# Patient Record
Sex: Male | Born: 1955
Health system: Southern US, Community
[De-identification: ages and names within clinical notes are randomized; demographics above are authoritative.]

## PROBLEM LIST (undated history)

## (undated) DIAGNOSIS — N400 Enlarged prostate without lower urinary tract symptoms: Secondary | ICD-10-CM

## (undated) DIAGNOSIS — K219 Gastro-esophageal reflux disease without esophagitis: Secondary | ICD-10-CM

## (undated) DIAGNOSIS — E785 Hyperlipidemia, unspecified: Secondary | ICD-10-CM

## (undated) HISTORY — DX: Hyperlipidemia, unspecified: E78.5

## (undated) HISTORY — DX: Gastro-esophageal reflux disease without esophagitis: K21.9

## (undated) HISTORY — PX: UPPER GASTROINTESTINAL ENDOSCOPY: SHX188

## (undated) HISTORY — PX: COLONOSCOPY: SHX174

## (undated) HISTORY — PX: POLYPECTOMY: SHX149

## (undated) HISTORY — DX: Benign prostatic hyperplasia without lower urinary tract symptoms: N40.0

---
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
## 2010-06-22 DIAGNOSIS — N400 Enlarged prostate without lower urinary tract symptoms: Secondary | ICD-10-CM

## 2010-06-22 HISTORY — DX: Benign prostatic hyperplasia without lower urinary tract symptoms: N40.0

## 2010-07-02 LAB — HM DIABETES EYE EXAM: HM Diabetic Eye Exam: NORMAL

## 2011-03-03 ENCOUNTER — Other Ambulatory Visit (INDEPENDENT_AMBULATORY_CARE_PROVIDER_SITE_OTHER): Payer: BC Managed Care – PPO

## 2011-03-03 ENCOUNTER — Encounter: Payer: Self-pay | Admitting: Internal Medicine

## 2011-03-03 ENCOUNTER — Ambulatory Visit (INDEPENDENT_AMBULATORY_CARE_PROVIDER_SITE_OTHER): Payer: BC Managed Care – PPO | Admitting: Internal Medicine

## 2011-03-03 VITALS — BP 110/70 | HR 78 | Temp 98.6°F | Resp 16 | Wt 252.0 lb

## 2011-03-03 DIAGNOSIS — D126 Benign neoplasm of colon, unspecified: Secondary | ICD-10-CM

## 2011-03-03 DIAGNOSIS — E7800 Pure hypercholesterolemia, unspecified: Secondary | ICD-10-CM

## 2011-03-03 DIAGNOSIS — K635 Polyp of colon: Secondary | ICD-10-CM | POA: Insufficient documentation

## 2011-03-03 DIAGNOSIS — E785 Hyperlipidemia, unspecified: Secondary | ICD-10-CM | POA: Insufficient documentation

## 2011-03-03 DIAGNOSIS — IMO0001 Reserved for inherently not codable concepts without codable children: Secondary | ICD-10-CM

## 2011-03-03 DIAGNOSIS — E118 Type 2 diabetes mellitus with unspecified complications: Secondary | ICD-10-CM | POA: Insufficient documentation

## 2011-03-03 LAB — URINALYSIS, ROUTINE W REFLEX MICROSCOPIC
Bilirubin Urine: NEGATIVE
Ketones, ur: NEGATIVE
Leukocytes, UA: NEGATIVE
Nitrite: NEGATIVE
Specific Gravity, Urine: >=1.03 (ref 1.000–1.030)
Total Protein, Urine: NEGATIVE
pH: 5.5 (ref 5.0–8.0)

## 2011-03-03 LAB — COMPREHENSIVE METABOLIC PANEL
BUN: 18 mg/dL (ref 6–23)
CO2: 27 mEq/L (ref 19–32)
Calcium: 9.4 mg/dL (ref 8.4–10.5)
Chloride: 104 mEq/L (ref 96–112)
Creatinine, Ser: 0.9 mg/dL (ref 0.4–1.5)
GFR: 90.59 mL/min (ref >60.00)

## 2011-03-03 LAB — LIPID PANEL
Total CHOL/HDL Ratio: 5
Triglycerides: 101 mg/dL (ref 0.0–149.0)

## 2011-03-03 LAB — CBC WITH DIFFERENTIAL/PLATELET
Eosinophils Relative: 6.1 % — ABNORMAL HIGH (ref 0.0–5.0)
HCT: 47.4 % (ref 39.0–52.0)
Hemoglobin: 15.9 g/dL (ref 13.0–17.0)
Lymphs Abs: 1.8 10*3/uL (ref 0.7–4.0)
MCV: 89.2 fl (ref 78.0–100.0)
Monocytes Relative: 8.1 % (ref 3.0–12.0)
Neutro Abs: 3.3 10*3/uL (ref 1.4–7.7)
RDW: 13.3 % (ref 11.5–14.6)
WBC: 5.9 10*3/uL (ref 4.5–10.5)

## 2011-03-03 NOTE — Patient Instructions (Signed)
Diabetes, Type 2 Diabetes is a lasting (chronic) disease. In type 2 diabetes, the pancreas does not make enough insulin (a hormone), and the body does not respond normally to the insulin that is made. This type of diabetes was also previously called adult onset diabetes. About 90% of all those who have diabetes have type 2. It usually occurs after the age of 40 but can occur at any age. CAUSES Unlike type 1 diabetes, which happens because insulin is no longer being made, type 2 diabetes happens because the body is making less insulin and has trouble using the insulin properly. SYMPTOMS  Drinking more than usual.   Urinating more than usual.   Blurred vision.   Dry, itchy skin.   Frequent infection like yeast infections in women.   More tired than usual (fatigue).  TREATMENT  Healthy eating.   Exercise.   Medication, if needed.   Monitoring blood glucose (sugar).   Seeing your caregiver regularly.  HOME CARE INSTRUCTIONS  Check your blood glucose (sugar) at least once daily. More frequent monitoring may be necessary, depending on your medications and on how well your diabetes is controlled. Your caregiver will advise you.   Take your medicine as directed by your caregiver.   Do not smoke.   Make wise food choices. Ask your caregiver for information. Weight loss can improve your diabetes.   Learn about low blood glucose (hypoglycemia) and how to treat it.   Get your eyes checked regularly.   Have a yearly physical exam. Have your blood pressure checked. Get your blood and urine tested.   Wear a pendant or bracelet saying that you have diabetes.   Check your feet every night for sores. Let your caregiver know if you have sores that are not healing.  SEEK MEDICAL CARE IF:  You are having problems keeping your blood glucose at target range.   You feel you might be having problems with your medicines.   You have symptoms of an illness that is not improving after 24  hours.   You have a sore or wound that is not healing.   You notice a change in vision or a new problem with your vision.   You develop a fever of more than 100.5.  Document Released: 06/08/2005 Document Re-Released: 06/30/2009 [employer] Patient Information 2011 [employer]. 

## 2011-03-04 ENCOUNTER — Encounter: Payer: Self-pay | Admitting: Internal Medicine

## 2011-03-04 LAB — MICROALBUMIN / CREATININE URINE RATIO
Creatinine,U: 220.5 mg/dL
Microalb Creat Ratio: 0.5 mg/g (ref 0.0–30.0)

## 2011-03-04 MED ORDER — ATORVASTATIN CALCIUM 40 MG PO TABS: 40.0000 mg | ORAL_TABLET | Freq: Every day | ORAL | Status: DC

## 2011-03-04 NOTE — Assessment & Plan Note (Signed)
Check FLP, TSH, and CMP today 

## 2011-03-04 NOTE — Progress Notes (Signed)
Subjective:    Patient ID: Randy Rodriguez, male    DOB: 11-Sep-1955, 55 y.o.   MRN: [id number]  Diabetes He has type 2 diabetes mellitus. His disease course has been stable. There are no hypoglycemic associated symptoms. Pertinent negatives for hypoglycemia include no pallor. Pertinent negatives for diabetes include no blurred vision, no chest pain, no fatigue, no foot paresthesias, no foot ulcerations, no polydipsia, no polyphagia, no polyuria, no visual change, no weakness and no weight loss. There are no hypoglycemic complications. Symptoms are stable. There are no diabetic complications. Current diabetic treatment includes diet. He is compliant with treatment most of the time. His weight is stable. He is following a generally healthy diet. Meal planning includes avoidance of concentrated sweets. He has not had a previous visit with a dietician. He participates in exercise intermittently. There is no change in his home blood glucose trend. An ACE inhibitor/angiotensin II receptor blocker is not being taken. He does not see a podiatrist.Eye exam is not current.  Hyperlipidemia This is a chronic problem. The current episode started more than 1 year ago. The problem is uncontrolled. Recent lipid tests were reviewed and are variable. Exacerbating diseases include diabetes and obesity. He has no history of chronic renal disease, hypothyroidism, liver disease or nephrotic syndrome. Factors aggravating his hyperlipidemia include no known factors. Pertinent negatives include no chest pain, focal sensory loss, focal weakness, leg pain, myalgias or shortness of breath. He is currently on no antihyperlipidemic treatment. Compliance problems include adherence to diet and adherence to exercise.       Review of Systems  Constitutional: Positive for unexpected weight change (weight gain). Negative for fever, chills, weight loss, diaphoresis, activity change, appetite change and fatigue.  HENT: Negative.   Eyes:  Negative.  Negative for blurred vision.  Respiratory: Negative for apnea, cough, choking, shortness of breath, wheezing and stridor.   Cardiovascular: Negative for chest pain, palpitations and leg swelling.  Gastrointestinal: Negative for nausea, vomiting, abdominal pain, diarrhea, constipation and blood in stool.  Genitourinary: Negative for dysuria, urgency, polyuria, frequency, hematuria, decreased urine volume, enuresis and difficulty urinating.  Musculoskeletal: Negative for myalgias, back pain, joint swelling, arthralgias and gait problem.  Skin: Negative for color change, pallor and rash.  Neurological: Negative.  Negative for focal weakness and weakness.  Hematological: Negative for polydipsia and polyphagia. Does not bruise/bleed easily.  Psychiatric/Behavioral: Negative.        Objective:   Physical Exam  Vitals reviewed. Constitutional: He is oriented to person, place, and time. He appears well-developed and well-nourished. No distress.  HENT:  Mouth/Throat: Oropharynx is clear and moist. No oropharyngeal exudate.  Eyes: Conjunctivae are normal. Right eye exhibits no discharge. Left eye exhibits no discharge. No scleral icterus.  Neck: Normal range of motion. Neck supple. No JVD present. No tracheal deviation present. No thyromegaly present.  Cardiovascular: Normal rate, regular rhythm, normal heart sounds and intact distal pulses.  Exam reveals no gallop and no friction rub.   No murmur heard. Pulmonary/Chest: Effort normal and breath sounds normal. No stridor. No respiratory distress. He has no wheezes. He has no rales. He exhibits no tenderness.  Abdominal: Soft. Bowel sounds are normal. He exhibits no distension and no mass. There is no tenderness. There is no rebound and no guarding.  Musculoskeletal: Normal range of motion. He exhibits no edema and no tenderness.  Lymphadenopathy:    He has no cervical adenopathy.  Neurological: He is oriented to person, place, and time.  He displays normal reflexes. He  exhibits normal muscle tone. Coordination normal.  Skin: Skin is warm and dry. No rash noted. He is not diaphoretic. No erythema. No pallor.  Psychiatric: He has a normal mood and affect. His behavior is normal. Judgment and thought content normal.          Assessment & Plan:

## 2011-03-04 NOTE — Assessment & Plan Note (Signed)
I will check his A1C today 

## 2011-03-05 ENCOUNTER — Encounter: Payer: Self-pay | Admitting: Internal Medicine

## 2011-04-06 ENCOUNTER — Ambulatory Visit (AMBULATORY_SURGERY_CENTER): Payer: BC Managed Care – PPO

## 2011-04-06 VITALS — Ht 75.0 in | Wt 250.0 lb

## 2011-04-06 DIAGNOSIS — Z1211 Encounter for screening for malignant neoplasm of colon: Secondary | ICD-10-CM

## 2011-04-06 MED ORDER — PEG-KCL-NACL-NASULF-NA ASC-C 100 G PO SOLR: 1.0000 | Freq: Once | ORAL | Status: DC

## 2011-04-07 ENCOUNTER — Encounter: Payer: Self-pay | Admitting: Internal Medicine

## 2011-04-17 ENCOUNTER — Ambulatory Visit (AMBULATORY_SURGERY_CENTER): Payer: BC Managed Care – PPO | Admitting: Internal Medicine

## 2011-04-17 ENCOUNTER — Encounter: Payer: Self-pay | Admitting: Internal Medicine

## 2011-04-17 VITALS — BP 142/97 | HR 74 | Temp 97.5°F | Resp 15 | Ht 75.0 in | Wt 250.0 lb

## 2011-04-17 DIAGNOSIS — D128 Benign neoplasm of rectum: Secondary | ICD-10-CM

## 2011-04-17 DIAGNOSIS — D126 Benign neoplasm of colon, unspecified: Secondary | ICD-10-CM

## 2011-04-17 DIAGNOSIS — D129 Benign neoplasm of anus and anal canal: Secondary | ICD-10-CM

## 2011-04-17 DIAGNOSIS — Z1211 Encounter for screening for malignant neoplasm of colon: Secondary | ICD-10-CM

## 2011-04-17 MED ORDER — SODIUM CHLORIDE 0.9 % IV SOLN: 500.0000 mL | INTRAVENOUS | Status: DC

## 2011-04-17 NOTE — Patient Instructions (Signed)
PLEASE FOLLOW DISCHARGE INSTRUCTIONS GIVEN TODAY. COLON POLYPS X2 REMOVED TODAY AND SENT TO LAB, YOU WILL RECEIVE RESULT LETTER IN YOUR MAIL IN 1-2 WEEKS. SEE HANDOUTS GIVEN TODAY FOR POLYPS,DIVERTICULOSIS AND HIGH FIBER DIET. CALL us WITH ANY QUESTIONS OR CONCERNS. THANK YOU!!!!

## 2011-04-20 ENCOUNTER — Telehealth: Payer: Self-pay | Admitting: *Deleted

## 2011-04-20 NOTE — Telephone Encounter (Signed)
LMOM

## 2011-04-26 ENCOUNTER — Encounter: Payer: Self-pay | Admitting: Internal Medicine

## 2011-08-19 ENCOUNTER — Ambulatory Visit (INDEPENDENT_AMBULATORY_CARE_PROVIDER_SITE_OTHER): Payer: BC Managed Care – PPO | Admitting: Internal Medicine

## 2011-08-19 ENCOUNTER — Encounter: Payer: Self-pay | Admitting: Internal Medicine

## 2011-08-19 ENCOUNTER — Other Ambulatory Visit (INDEPENDENT_AMBULATORY_CARE_PROVIDER_SITE_OTHER): Payer: BC Managed Care – PPO

## 2011-08-19 DIAGNOSIS — E7800 Pure hypercholesterolemia, unspecified: Secondary | ICD-10-CM

## 2011-08-19 DIAGNOSIS — Z23 Encounter for immunization: Secondary | ICD-10-CM

## 2011-08-19 LAB — COMPREHENSIVE METABOLIC PANEL
ALT: 29 U/L (ref 0–53)
BUN: 17 mg/dL (ref 6–23)
CO2: 32 mEq/L (ref 19–32)
Creatinine, Ser: 1 mg/dL (ref 0.4–1.5)
GFR: 79.39 mL/min (ref >60.00)
Total Bilirubin: 0.7 mg/dL (ref 0.3–1.2)

## 2011-08-19 LAB — LIPID PANEL
Cholesterol: 156 mg/dL (ref 0–200)
LDL Cholesterol: 91 mg/dL (ref 0–99)
Triglycerides: 83 mg/dL (ref 0.0–149.0)
VLDL: 16.6 mg/dL (ref 0.0–40.0)

## 2011-08-19 LAB — TSH: TSH: 0.78 u[IU]/mL (ref 0.35–5.50)

## 2011-08-19 LAB — HM DIABETES FOOT EXAM: HM Diabetic Foot Exam: NORMAL

## 2011-08-19 NOTE — Progress Notes (Signed)
  Subjective:    Patient ID: Randy Rodriguez, male    DOB: [birth year], 56 y.o.   MRN: [id number]  Diabetes He presents for his follow-up diabetic visit. He has type 2 diabetes mellitus. His disease course has been stable. There are no hypoglycemic associated symptoms. Pertinent negatives for diabetes include no blurred vision, no chest pain, no fatigue, no foot paresthesias, no foot ulcerations, no polydipsia, no polyphagia, no polyuria, no visual change, no weakness and no weight loss. There are no hypoglycemic complications. Symptoms are stable. There are no diabetic complications. Current diabetic treatment includes diet. He is compliant with treatment all of the time. His weight is stable. He is following a generally healthy diet. Meal planning includes avoidance of concentrated sweets. He has not had a previous visit with a dietician. He participates in exercise every other day. There is no change in his home blood glucose trend. An ACE inhibitor/angiotensin II receptor blocker is not being taken. He does not see a podiatrist.Eye exam is current.      Review of Systems  Constitutional: Negative.  Negative for weight loss and fatigue.  HENT: Negative.   Eyes: Negative.  Negative for blurred vision.  Respiratory: Negative.   Cardiovascular: Negative.  Negative for chest pain.  Gastrointestinal: Negative.   Genitourinary: Negative.  Negative for polyuria.  Musculoskeletal: Negative.   Skin: Negative.   Neurological: Negative.  Negative for weakness.  Hematological: Negative.  Negative for polydipsia and polyphagia.  Psychiatric/Behavioral: Negative.        Objective:   Physical Exam  Vitals reviewed. Constitutional: He is oriented to person, place, and time. He appears well-developed and well-nourished. No distress.  HENT:  Head: Normocephalic and atraumatic.  Mouth/Throat: Oropharynx is clear and moist. No oropharyngeal exudate.  Eyes: Conjunctivae are normal. Right eye exhibits no  discharge. Left eye exhibits no discharge. No scleral icterus.  Neck: Normal range of motion. Neck supple. No JVD present. No tracheal deviation present. No thyromegaly present.  Cardiovascular: Normal rate, regular rhythm, normal heart sounds and intact distal pulses.  Exam reveals no gallop and no friction rub.   No murmur heard. Pulmonary/Chest: Effort normal and breath sounds normal. No stridor. No respiratory distress. He has no wheezes. He has no rales. He exhibits no tenderness.  Abdominal: Soft. Bowel sounds are normal. He exhibits no distension and no mass. There is no tenderness. There is no rebound and no guarding.  Musculoskeletal: Normal range of motion. He exhibits no edema and no tenderness.  Lymphadenopathy:    He has no cervical adenopathy.  Neurological: He is oriented to person, place, and time.  Skin: Skin is warm and dry. No rash noted. He is not diaphoretic. No erythema. No pallor.  Psychiatric: He has a normal mood and affect. His behavior is normal. Judgment and thought content normal.          Lab Results  Component Value Date   WBC 5.9 03/03/2011   HGB 15.9 03/03/2011   HCT 47.4 03/03/2011   PLT 189.0 03/03/2011   GLUCOSE 114* 03/03/2011   CHOL 195 03/03/2011   TRIG 101.0 03/03/2011   HDL 42.50 03/03/2011   LDLCALC 132* 03/03/2011   ALT 26 03/03/2011   AST 20 03/03/2011   NA 140 03/03/2011   K 4.3 03/03/2011   CL 104 03/03/2011   CREATININE 0.9 03/03/2011   BUN 18 03/03/2011   CO2 27 03/03/2011   HGBA1C 7.1* 03/03/2011   MICROALBUR 1.0 03/03/2011   Assessment & Plan:

## 2011-08-19 NOTE — Patient Instructions (Signed)

## 2011-08-21 NOTE — Assessment & Plan Note (Signed)
FLP and CMP today 

## 2011-08-21 NOTE — Assessment & Plan Note (Signed)
I will check his a1c today and will monitor his renal function 

## 2011-12-17 ENCOUNTER — Other Ambulatory Visit (INDEPENDENT_AMBULATORY_CARE_PROVIDER_SITE_OTHER): Payer: BC Managed Care – PPO

## 2011-12-17 ENCOUNTER — Ambulatory Visit (INDEPENDENT_AMBULATORY_CARE_PROVIDER_SITE_OTHER): Payer: BC Managed Care – PPO | Admitting: Internal Medicine

## 2011-12-17 ENCOUNTER — Encounter: Payer: Self-pay | Admitting: Internal Medicine

## 2011-12-17 VITALS — BP 114/76 | HR 69 | Temp 97.1°F | Resp 16 | Wt 248.0 lb

## 2011-12-17 DIAGNOSIS — IMO0001 Reserved for inherently not codable concepts without codable children: Secondary | ICD-10-CM

## 2011-12-17 DIAGNOSIS — E7800 Pure hypercholesterolemia, unspecified: Secondary | ICD-10-CM

## 2011-12-17 LAB — BASIC METABOLIC PANEL
Calcium: 9.6 mg/dL (ref 8.4–10.5)
Creatinine, Ser: 1 mg/dL (ref 0.4–1.5)
GFR: 82.05 mL/min (ref >60.00)

## 2011-12-17 LAB — URINALYSIS, ROUTINE W REFLEX MICROSCOPIC
Hgb urine dipstick: NEGATIVE
Total Protein, Urine: NEGATIVE
Urine Glucose: NEGATIVE
pH: 6 (ref 5.0–8.0)

## 2011-12-17 LAB — HEMOGLOBIN A1C: Hgb A1c MFr Bld: 7.6 % — ABNORMAL HIGH (ref 4.6–6.5)

## 2011-12-17 MED ORDER — SAXAGLIPTIN HCL 5 MG PO TABS: 5.0000 mg | ORAL_TABLET | Freq: Every day | ORAL | Status: DC

## 2011-12-17 MED ORDER — ATORVASTATIN CALCIUM 40 MG PO TABS: 40.0000 mg | ORAL_TABLET | Freq: Every day | ORAL | Status: DC

## 2011-12-17 NOTE — Patient Instructions (Addendum)

## 2011-12-17 NOTE — Assessment & Plan Note (Signed)
He is doing well on lipitor 

## 2011-12-17 NOTE — Progress Notes (Signed)
Subjective:    Patient ID: Randy Rodriguez, male    DOB: [birth year], 56 y.o.   MRN: [id number]  Diabetes He presents for his follow-up diabetic visit. He has type 2 diabetes mellitus. His disease course has been stable. There are no hypoglycemic associated symptoms. Associated symptoms include foot paresthesias. Pertinent negatives for diabetes include no blurred vision, no chest pain, no fatigue, no foot ulcerations, no polydipsia, no polyphagia, no polyuria, no visual change, no weakness and no weight loss. There are no hypoglycemic complications. Symptoms are stable. There are no diabetic complications. Current diabetic treatment includes diet. He is compliant with treatment most of the time. His weight is stable. He is following a generally healthy diet. Meal planning includes avoidance of concentrated sweets. He has not had a previous visit with a dietician. He participates in exercise intermittently. There is no change in his home blood glucose trend. An ACE inhibitor/angiotensin II receptor blocker is not being taken. He does not see a podiatrist.Eye exam is current.      Review of Systems  Constitutional: Negative for fever, chills, weight loss, diaphoresis, activity change, appetite change, fatigue and unexpected weight change.  HENT: Negative.   Eyes: Negative.  Negative for blurred vision.  Respiratory: Negative for cough, chest tightness, shortness of breath, wheezing and stridor.   Cardiovascular: Negative for chest pain, palpitations and leg swelling.  Gastrointestinal: Negative.   Genitourinary: Negative.  Negative for polyuria.  Musculoskeletal: Negative.   Skin: Negative.   Neurological: Negative.  Negative for weakness.  Hematological: Negative for polydipsia, polyphagia and adenopathy. Does not bruise/bleed easily.  Psychiatric/Behavioral: Negative.        Objective:   Physical Exam  Vitals reviewed. Constitutional: He is oriented to person, place, and time. He appears  well-developed and well-nourished. No distress.  HENT:  Head: Normocephalic and atraumatic.  Mouth/Throat: Oropharynx is clear and moist. No oropharyngeal exudate.  Eyes: Conjunctivae are normal. Right eye exhibits no discharge. Left eye exhibits no discharge. No scleral icterus.  Neck: Normal range of motion. Neck supple. No JVD present. No tracheal deviation present. No thyromegaly present.  Cardiovascular: Normal rate, regular rhythm, normal heart sounds and intact distal pulses.  Exam reveals no gallop and no friction rub.   No murmur heard. Pulmonary/Chest: Effort normal and breath sounds normal. No stridor. No respiratory distress. He has no wheezes. He has no rales. He exhibits no tenderness.  Abdominal: Soft. Bowel sounds are normal. He exhibits no distension and no mass. There is no tenderness. There is no rebound and no guarding.  Musculoskeletal: Normal range of motion. He exhibits no edema and no tenderness.  Lymphadenopathy:    He has no cervical adenopathy.  Neurological: He is oriented to person, place, and time.  Skin: Skin is warm and dry. No rash noted. He is not diaphoretic. No erythema. No pallor.  Psychiatric: He has a normal mood and affect. His behavior is normal. Judgment and thought content normal.     Lab Results  Component Value Date   WBC 5.9 03/03/2011   HGB 15.9 03/03/2011   HCT 47.4 03/03/2011   PLT 189.0 03/03/2011   GLUCOSE 140* 08/19/2011   CHOL 156 08/19/2011   TRIG 83.0 08/19/2011   HDL 48.00 08/19/2011   LDLCALC 91 08/19/2011   ALT 29 08/19/2011   AST 19 08/19/2011   NA 140 08/19/2011   K 4.6 08/19/2011   CL 104 08/19/2011   CREATININE 1.0 08/19/2011   BUN 17 08/19/2011   CO2 32 08/19/2011  TSH 0.78 08/19/2011   HGBA1C 7.8* 08/19/2011   MICROALBUR 1.0 03/03/2011       Assessment & Plan:

## 2011-12-17 NOTE — Assessment & Plan Note (Signed)
Start onglyza, I will recheck his a1c and will check his renal function today

## 2011-12-18 ENCOUNTER — Encounter: Payer: Self-pay | Admitting: Internal Medicine

## 2011-12-18 LAB — MICROALBUMIN / CREATININE URINE RATIO
Creatinine,U: 232.6 mg/dL
Microalb, Ur: 1.3 mg/dL (ref 0.0–1.9)

## 2012-02-23 ENCOUNTER — Other Ambulatory Visit: Payer: Self-pay

## 2012-02-23 MED ORDER — SAXAGLIPTIN HCL 5 MG PO TABS: 5.0000 mg | ORAL_TABLET | Freq: Every day | ORAL | Status: DC

## 2012-04-20 ENCOUNTER — Other Ambulatory Visit (INDEPENDENT_AMBULATORY_CARE_PROVIDER_SITE_OTHER): Payer: BC Managed Care – PPO

## 2012-04-20 ENCOUNTER — Encounter: Payer: Self-pay | Admitting: Internal Medicine

## 2012-04-20 ENCOUNTER — Ambulatory Visit (INDEPENDENT_AMBULATORY_CARE_PROVIDER_SITE_OTHER): Payer: BC Managed Care – PPO | Admitting: Internal Medicine

## 2012-04-20 VITALS — BP 118/80 | HR 70 | Temp 97.3°F | Resp 18 | Wt 244.0 lb

## 2012-04-20 LAB — COMPREHENSIVE METABOLIC PANEL
ALT: 23 U/L (ref 0–53)
AST: 19 U/L (ref 0–37)
Creatinine, Ser: 1 mg/dL (ref 0.4–1.5)
GFR: 78.32 mL/min (ref >60.00)
Total Bilirubin: 0.7 mg/dL (ref 0.3–1.2)

## 2012-04-20 LAB — HM DIABETES FOOT EXAM: HM Diabetic Foot Exam: NORMAL

## 2012-04-20 LAB — HEMOGLOBIN A1C: Hgb A1c MFr Bld: 7.2 % — ABNORMAL HIGH (ref 4.6–6.5)

## 2012-04-20 MED ORDER — METFORMIN HCL 500 MG PO TABS: 500.0000 mg | ORAL_TABLET | Freq: Two times a day (BID) | ORAL | Status: DC

## 2012-04-20 NOTE — Assessment & Plan Note (Signed)
He tells me that onglyza is too expensive so I have changed to metformin, today I will check his a1c and his renal function

## 2012-04-20 NOTE — Progress Notes (Signed)
Subjective:    Patient ID: Randy Rodriguez, male    DOB: Nov 22, 1955, 56 y.o.   MRN: [id number]  Diabetes He presents for his follow-up diabetic visit. He has type 2 diabetes mellitus. His disease course has been stable. There are no hypoglycemic associated symptoms. Pertinent negatives for hypoglycemia include no pallor. Associated symptoms include fatigue. Pertinent negatives for diabetes include no blurred vision, no chest pain, no foot paresthesias, no foot ulcerations, no polydipsia, no polyphagia, no polyuria, no visual change, no weakness and no weight loss. There are no hypoglycemic complications. Symptoms are stable. There are no diabetic complications. Current diabetic treatment includes oral agent (monotherapy). He is compliant with treatment all of the time. His weight is decreasing steadily. He is following a generally healthy diet. Meal planning includes avoidance of concentrated sweets. He participates in exercise intermittently. There is no change in his home blood glucose trend. An ACE inhibitor/angiotensin II receptor blocker is not being taken. He does not see a podiatrist.Eye exam is not current.      Review of Systems  Constitutional: Positive for fatigue. Negative for fever, chills, weight loss, diaphoresis, activity change, appetite change and unexpected weight change.  HENT: Negative.   Eyes: Negative.  Negative for blurred vision.  Respiratory: Negative for choking, chest tightness, shortness of breath, wheezing and stridor.   Cardiovascular: Negative for chest pain, palpitations and leg swelling.  Gastrointestinal: Negative for nausea, vomiting, abdominal pain, diarrhea, constipation and anal bleeding.  Genitourinary: Negative.  Negative for polyuria.  Musculoskeletal: Negative for myalgias, back pain, joint swelling, arthralgias and gait problem.  Skin: Negative for color change, pallor, rash and wound.  Neurological: Negative.  Negative for weakness.  Hematological:  Negative for polydipsia, polyphagia and adenopathy. Does not bruise/bleed easily.  Psychiatric/Behavioral: Negative.        Objective:   Physical Exam  Vitals reviewed. Constitutional: He is oriented to person, place, and time. He appears well-developed and well-nourished. No distress.  HENT:  Head: Normocephalic and atraumatic.  Mouth/Throat: Oropharynx is clear and moist. No oropharyngeal exudate.  Eyes: Conjunctivae normal are normal. Right eye exhibits no discharge. Left eye exhibits no discharge. No scleral icterus.  Neck: Normal range of motion. Neck supple. No JVD present. No tracheal deviation present. No thyromegaly present.  Cardiovascular: Normal rate, regular rhythm, normal heart sounds and intact distal pulses.  Exam reveals no gallop and no friction rub.   No murmur heard. Pulmonary/Chest: Effort normal and breath sounds normal. No stridor. No respiratory distress. He has no wheezes. He has no rales. He exhibits no tenderness.  Abdominal: Soft. Bowel sounds are normal. He exhibits no distension and no mass. There is no tenderness. There is no rebound and no guarding.  Musculoskeletal: Normal range of motion. He exhibits no edema and no tenderness.  Lymphadenopathy:    He has no cervical adenopathy.  Neurological: He is oriented to person, place, and time.  Skin: Skin is warm and dry. No rash noted. He is not diaphoretic. No erythema. No pallor.  Psychiatric: He has a normal mood and affect. His behavior is normal. Judgment and thought content normal.     Lab Results  Component Value Date   WBC 5.9 03/03/2011   HGB 15.9 03/03/2011   HCT 47.4 03/03/2011   PLT 189.0 03/03/2011   GLUCOSE 137* 12/17/2011   CHOL 156 08/19/2011   TRIG 83.0 08/19/2011   HDL 48.00 08/19/2011   LDLCALC 91 08/19/2011   ALT 29 08/19/2011   AST 19 08/19/2011  NA 138 12/17/2011   K 4.5 12/17/2011   CL 105 12/17/2011   CREATININE 1.0 12/17/2011   BUN 18 12/17/2011   CO2 27 12/17/2011   TSH 0.78 08/19/2011    HGBA1C 7.6* 12/17/2011   MICROALBUR 1.3 12/17/2011       Assessment & Plan:

## 2012-04-20 NOTE — Patient Instructions (Signed)

## 2012-08-19 ENCOUNTER — Encounter: Payer: Self-pay | Admitting: Internal Medicine

## 2012-08-19 ENCOUNTER — Other Ambulatory Visit (INDEPENDENT_AMBULATORY_CARE_PROVIDER_SITE_OTHER): Payer: Managed Care, Other (non HMO)

## 2012-08-19 ENCOUNTER — Ambulatory Visit (INDEPENDENT_AMBULATORY_CARE_PROVIDER_SITE_OTHER): Payer: Managed Care, Other (non HMO) | Admitting: Internal Medicine

## 2012-08-19 VITALS — BP 118/72 | HR 75 | Temp 97.6°F | Resp 16 | Ht 73.0 in | Wt 246.0 lb

## 2012-08-19 DIAGNOSIS — IMO0001 Reserved for inherently not codable concepts without codable children: Secondary | ICD-10-CM

## 2012-08-19 DIAGNOSIS — E7800 Pure hypercholesterolemia, unspecified: Secondary | ICD-10-CM

## 2012-08-19 DIAGNOSIS — Z0000 Encounter for general adult medical examination without abnormal findings: Secondary | ICD-10-CM | POA: Insufficient documentation

## 2012-08-19 DIAGNOSIS — Z23 Encounter for immunization: Secondary | ICD-10-CM

## 2012-08-19 LAB — COMPREHENSIVE METABOLIC PANEL
AST: 22 U/L (ref 0–37)
Albumin: 4.1 g/dL (ref 3.5–5.2)
Alkaline Phosphatase: 79 U/L (ref 39–117)
BUN: 22 mg/dL (ref 6–23)
Creatinine, Ser: 1.1 mg/dL (ref 0.4–1.5)
Glucose, Bld: 145 mg/dL — ABNORMAL HIGH (ref 70–99)
Potassium: 4.6 mEq/L (ref 3.5–5.1)

## 2012-08-19 LAB — CBC WITH DIFFERENTIAL/PLATELET
Basophils Relative: 0.8 % (ref 0.0–3.0)
Eosinophils Relative: 9.8 % — ABNORMAL HIGH (ref 0.0–5.0)
Hemoglobin: 15.9 g/dL (ref 13.0–17.0)
Lymphocytes Relative: 30.3 % (ref 12.0–46.0)
MCV: 86.8 fl (ref 78.0–100.0)
Monocytes Absolute: 0.5 10*3/uL (ref 0.1–1.0)
Neutro Abs: 3.4 10*3/uL (ref 1.4–7.7)
Neutrophils Relative %: 52 % (ref 43.0–77.0)
RBC: 5.35 Mil/uL (ref 4.22–5.81)
WBC: 6.6 10*3/uL (ref 4.5–10.5)

## 2012-08-19 LAB — URINALYSIS, ROUTINE W REFLEX MICROSCOPIC
Nitrite: NEGATIVE
Specific Gravity, Urine: 1.025 (ref 1.000–1.030)
Total Protein, Urine: NEGATIVE
Urobilinogen, UA: 0.2 (ref 0.0–1.0)

## 2012-08-19 LAB — LIPID PANEL
HDL: 41.6 mg/dL (ref >39.00)
LDL Cholesterol: 94 mg/dL (ref 0–99)
Total CHOL/HDL Ratio: 4
Triglycerides: 103 mg/dL (ref 0.0–149.0)

## 2012-08-19 LAB — FECAL OCCULT BLOOD, GUAIAC: Fecal Occult Blood: NEGATIVE

## 2012-08-19 LAB — PSA: PSA: 0.76 ng/mL (ref 0.10–4.00)

## 2012-08-19 NOTE — Assessment & Plan Note (Signed)
I will check his a1c and will monitor his renal function 

## 2012-08-19 NOTE — Assessment & Plan Note (Signed)
Exam done Vaccines were updated Labs ordered Pt ed material was given 

## 2012-08-19 NOTE — Assessment & Plan Note (Signed)
He is doing well on lipitor 

## 2012-08-19 NOTE — Progress Notes (Signed)
Subjective:    Patient ID: Randy Rodriguez, male    DOB: 1956-04-04, 58 y.o.   MRN: [id number]  Diabetes He presents for his follow-up diabetic visit. He has type 2 diabetes mellitus. His disease course has been worsening. There are no hypoglycemic associated symptoms. Pertinent negatives for hypoglycemia include no dizziness or pallor. Associated symptoms include polyuria. Pertinent negatives for diabetes include no blurred vision, no chest pain, no fatigue, no foot paresthesias, no foot ulcerations, no polydipsia, no polyphagia, no visual change, no weakness and no weight loss. There are no hypoglycemic complications. Symptoms are stable. There are no diabetic complications. Current diabetic treatment includes oral agent (monotherapy). He is compliant with treatment most of the time. His weight is increasing steadily. He is following a generally unhealthy diet. When asked about meal planning, he reported none. He has not had a previous visit with a dietician. He never participates in exercise. There is no change in his home blood glucose trend. An ACE inhibitor/angiotensin II receptor blocker is not being taken. He does not see a podiatrist.Eye exam is current.      Review of Systems  Constitutional: Positive for unexpected weight change (some weight gain). Negative for fever, chills, weight loss, diaphoresis, activity change, appetite change and fatigue.  HENT: Negative.   Eyes: Negative.  Negative for blurred vision.  Respiratory: Negative for apnea, cough, chest tightness, shortness of breath, wheezing and stridor.   Cardiovascular: Negative.  Negative for chest pain, palpitations and leg swelling.  Gastrointestinal: Negative.  Negative for nausea, vomiting, abdominal pain, diarrhea, constipation and blood in stool.  Endocrine: Positive for polyuria. Negative for cold intolerance, heat intolerance, polydipsia and polyphagia.  Genitourinary: Negative for dysuria, frequency, hematuria, flank pain,  decreased urine volume, scrotal swelling, difficulty urinating and testicular pain.  Musculoskeletal: Negative.  Negative for myalgias, back pain, joint swelling, arthralgias and gait problem.  Skin: Negative.  Negative for color change, pallor, rash and wound.  Allergic/Immunologic: Negative.   Neurological: Negative.  Negative for dizziness and weakness.  Hematological: Negative.  Negative for adenopathy. Does not bruise/bleed easily.  Psychiatric/Behavioral: Negative.        Objective:   Physical Exam  Vitals reviewed. Constitutional: He is oriented to person, place, and time. He appears well-developed and well-nourished. No distress.  HENT:  Head: Normocephalic and atraumatic.  Mouth/Throat: Oropharynx is clear and moist. No oropharyngeal exudate.  Eyes: Conjunctivae are normal. Right eye exhibits no discharge. Left eye exhibits no discharge. No scleral icterus.  Neck: Normal range of motion. Neck supple. No JVD present. No tracheal deviation present. No thyromegaly present.  Cardiovascular: Normal rate, regular rhythm, normal heart sounds and intact distal pulses.  Exam reveals no gallop and no friction rub.   No murmur heard. Pulmonary/Chest: Effort normal and breath sounds normal. No stridor. No respiratory distress. He has no wheezes. He has no rales. He exhibits no tenderness.  Abdominal: Soft. Bowel sounds are normal. He exhibits no distension and no mass. There is no tenderness. There is no rebound and no guarding. Hernia confirmed negative in the right inguinal area and confirmed negative in the left inguinal area.  Genitourinary: Rectum normal, prostate normal, testes normal and penis normal. Rectal exam shows no external hemorrhoid, no internal hemorrhoid, no fissure, no mass, no tenderness and anal tone normal. Guaiac negative stool. Prostate is not enlarged and not tender. Right testis shows no mass, no swelling and no tenderness. Right testis is descended. Left testis shows no  mass, no swelling and no tenderness.  Left testis is descended. Circumcised. No penile erythema or penile tenderness. No discharge found.  Musculoskeletal: Normal range of motion. He exhibits no edema and no tenderness.  Lymphadenopathy:    He has no cervical adenopathy.       Right: No inguinal adenopathy present.       Left: No inguinal adenopathy present.  Neurological: He is oriented to person, place, and time.  Skin: Skin is warm and dry. No rash noted. He is not diaphoretic. No erythema. No pallor.  Psychiatric: He has a normal mood and affect. His behavior is normal. Judgment and thought content normal.      Lab Results  Component Value Date   WBC 5.9 03/03/2011   HGB 15.9 03/03/2011   HCT 47.4 03/03/2011   PLT 189.0 03/03/2011   GLUCOSE 136* 04/20/2012   CHOL 156 08/19/2011   TRIG 83.0 08/19/2011   HDL 48.00 08/19/2011   LDLCALC 91 08/19/2011   ALT 23 04/20/2012   AST 19 04/20/2012   NA 140 04/20/2012   K 5.0 04/20/2012   CL 104 04/20/2012   CREATININE 1.0 04/20/2012   BUN 17 04/20/2012   CO2 31 04/20/2012   TSH 0.78 08/19/2011   HGBA1C 7.2* 04/20/2012   MICROALBUR 1.3 12/17/2011      Assessment & Plan:

## 2012-08-19 NOTE — Patient Instructions (Signed)
Health Maintenance, Males A healthy lifestyle and preventative care can promote health and wellness.  Maintain regular health, dental, and eye exams.  Eat a healthy diet. Foods like vegetables, fruits, whole grains, low-fat dairy products, and lean protein foods contain the nutrients you need without too many calories. Decrease your intake of foods high in solid fats, added sugars, and salt. Get information about a proper diet from your caregiver, if necessary.  Regular physical exercise is one of the most important things you can do for your health. Most adults should get at least 150 minutes of moderate-intensity exercise (any activity that increases your heart rate and causes you to sweat) each week. In addition, most adults need muscle-strengthening exercises on 2 or more days a week.   Maintain a healthy weight. The body mass index (BMI) is a screening tool to identify possible weight problems. It provides an estimate of body fat based on height and weight. Your caregiver can help determine your BMI, and can help you achieve or maintain a healthy weight. For adults 20 years and older:  A BMI below 18.5 is considered underweight.  A BMI of 18.5 to 24.9 is normal.  A BMI of 25 to 29.9 is considered overweight.  A BMI of 30 and above is considered obese.  Maintain normal blood lipids and cholesterol by exercising and minimizing your intake of saturated fat. Eat a balanced diet with plenty of fruits and vegetables. Blood tests for lipids and cholesterol should begin at age 20 and be repeated every 5 years. If your lipid or cholesterol levels are high, you are over 50, or you are a high risk for heart disease, you may need your cholesterol levels checked more frequently.Ongoing high lipid and cholesterol levels should be treated with medicines, if diet and exercise are not effective.  If you smoke, find out from your caregiver how to quit. If you do not use tobacco, do not start.  If you  choose to drink alcohol, do not exceed 2 drinks per day. One drink is considered to be 12 ounces (355 mL) of beer, 5 ounces (148 mL) of wine, or 1.5 ounces (44 mL) of liquor.  Avoid use of street drugs. Do not share needles with anyone. Ask for help if you need support or instructions about stopping the use of drugs.  High blood pressure causes heart disease and increases the risk of stroke. Blood pressure should be checked at least every 1 to 2 years. Ongoing high blood pressure should be treated with medicines if weight loss and exercise are not effective.  If you are 45 to 57 years old, ask your caregiver if you should take aspirin to prevent heart disease.  Diabetes screening involves taking a blood sample to check your fasting blood sugar level. This should be done once every 3 years, after age 45, if you are within normal weight and without risk factors for diabetes. Testing should be considered at a younger age or be carried out more frequently if you are overweight and have at least 1 risk factor for diabetes.  Colorectal cancer can be detected and often prevented. Most routine colorectal cancer screening begins at the age of 50 and continues through age 75. However, your caregiver may recommend screening at an earlier age if you have risk factors for colon cancer. On a yearly basis, your caregiver may provide home test kits to check for hidden blood in the stool. Use of a small camera at the end of a tube,   to directly examine the colon (sigmoidoscopy or colonoscopy), can detect the earliest forms of colorectal cancer. Talk to your caregiver about this at age 50, when routine screening begins. Direct examination of the colon should be repeated every 5 to 10 years through age 75, unless early forms of pre-cancerous polyps or small growths are found.  Hepatitis C blood testing is recommended for all people born from 1945 through 1965 and any individual with known risks for hepatitis C.  Healthy  men should no longer receive prostate-specific antigen (PSA) blood tests as part of routine cancer screening. Consult with your caregiver about prostate cancer screening.  Testicular cancer screening is not recommended for adolescents or adult males who have no symptoms. Screening includes self-exam, caregiver exam, and other screening tests. Consult with your caregiver about any symptoms you have or any concerns you have about testicular cancer.  Practice safe sex. Use condoms and avoid high-risk sexual practices to reduce the spread of sexually transmitted infections (STIs).  Use sunscreen with a sun protection factor (SPF) of 30 or greater. Apply sunscreen liberally and repeatedly throughout the day. You should seek shade when your shadow is shorter than you. Protect yourself by wearing long sleeves, pants, a wide-brimmed hat, and sunglasses year round, whenever you are outdoors.  Notify your caregiver of new moles or changes in moles, especially if there is a change in shape or color. Also notify your caregiver if a mole is larger than the size of a pencil eraser.  A one-time screening for abdominal aortic aneurysm (AAA) and surgical repair of large AAAs by sound wave imaging (ultrasonography) is recommended for ages 65 to 75 years who are current or former smokers.  Stay current with your immunizations. Document Released: 12/05/2007 Document Revised: 08/31/2011 Document Reviewed: 11/03/2010 [employer] Patient Information 2013 [employer].  

## 2012-11-01 ENCOUNTER — Other Ambulatory Visit: Payer: Self-pay | Admitting: Internal Medicine

## 2013-01-04 ENCOUNTER — Other Ambulatory Visit: Payer: Self-pay | Admitting: Internal Medicine

## 2013-01-31 ENCOUNTER — Encounter (HOSPITAL_BASED_OUTPATIENT_CLINIC_OR_DEPARTMENT_OTHER): Payer: Self-pay | Admitting: *Deleted

## 2013-01-31 ENCOUNTER — Emergency Department (HOSPITAL_BASED_OUTPATIENT_CLINIC_OR_DEPARTMENT_OTHER)
Admission: EM | Admit: 2013-01-31 | Discharge: 2013-01-31 | Disposition: A | Discharge status: Home / Self Care | Payer: Managed Care, Other (non HMO) | Attending: Emergency Medicine | Admitting: Emergency Medicine

## 2013-01-31 ENCOUNTER — Emergency Department (HOSPITAL_BASED_OUTPATIENT_CLINIC_OR_DEPARTMENT_OTHER): Payer: Managed Care, Other (non HMO)

## 2013-01-31 DIAGNOSIS — M549 Dorsalgia, unspecified: Secondary | ICD-10-CM | POA: Insufficient documentation

## 2013-01-31 DIAGNOSIS — Z79899 Other long term (current) drug therapy: Secondary | ICD-10-CM | POA: Insufficient documentation

## 2013-01-31 DIAGNOSIS — E785 Hyperlipidemia, unspecified: Secondary | ICD-10-CM | POA: Insufficient documentation

## 2013-01-31 DIAGNOSIS — R10819 Abdominal tenderness, unspecified site: Secondary | ICD-10-CM | POA: Insufficient documentation

## 2013-01-31 DIAGNOSIS — E119 Type 2 diabetes mellitus without complications: Secondary | ICD-10-CM | POA: Insufficient documentation

## 2013-01-31 DIAGNOSIS — R319 Hematuria, unspecified: Secondary | ICD-10-CM | POA: Insufficient documentation

## 2013-01-31 DIAGNOSIS — N12 Tubulo-interstitial nephritis, not specified as acute or chronic: Secondary | ICD-10-CM | POA: Insufficient documentation

## 2013-01-31 DIAGNOSIS — R350 Frequency of micturition: Secondary | ICD-10-CM | POA: Insufficient documentation

## 2013-01-31 DIAGNOSIS — Z87448 Personal history of other diseases of urinary system: Secondary | ICD-10-CM | POA: Insufficient documentation

## 2013-01-31 LAB — CBC WITH DIFFERENTIAL/PLATELET
Basophils Relative: 0 % (ref 0–1)
Eosinophils Absolute: 0 10*3/uL (ref 0.0–0.7)
Eosinophils Relative: 0 % (ref 0–5)
Lymphs Abs: 1.6 10*3/uL (ref 0.7–4.0)
MCH: 30.4 pg (ref 26.0–34.0)
MCHC: 34.3 g/dL (ref 30.0–36.0)
MCV: 88.8 fL (ref 78.0–100.0)
Monocytes Relative: 10 % (ref 3–12)
Neutrophils Relative %: 80 % — ABNORMAL HIGH (ref 43–77)
Platelets: 166 10*3/uL (ref 150–400)
RBC: 4.8 MIL/uL (ref 4.22–5.81)

## 2013-01-31 LAB — URINE MICROSCOPIC-ADD ON

## 2013-01-31 LAB — BASIC METABOLIC PANEL
BUN: 12 mg/dL (ref 6–23)
Calcium: 10.1 mg/dL (ref 8.4–10.5)
GFR calc Af Amer: >90 mL/min (ref >90)
GFR calc non Af Amer: 82 mL/min — ABNORMAL LOW (ref >90)
Glucose, Bld: 177 mg/dL — ABNORMAL HIGH (ref 70–99)
Sodium: 136 mEq/L (ref 135–145)

## 2013-01-31 LAB — URINALYSIS, ROUTINE W REFLEX MICROSCOPIC
Bilirubin Urine: NEGATIVE
Nitrite: POSITIVE — AB
Protein, ur: >300 mg/dL — AB
Urobilinogen, UA: 1 mg/dL (ref 0.0–1.0)

## 2013-01-31 MED ORDER — SODIUM CHLORIDE 0.9 % IV BOLUS (SEPSIS)
500.0000 mL | Freq: Once | INTRAVENOUS | Status: AC
  Administered 2013-01-31: 500 mL via INTRAVENOUS

## 2013-01-31 MED ORDER — IOHEXOL 300 MG/ML  SOLN
50.0000 mL | Freq: Once | INTRAMUSCULAR | Status: AC | PRN
  Administered 2013-01-31: 50 mL via ORAL

## 2013-01-31 MED ORDER — DEXTROSE 5 % IV SOLN
1.0000 g | Freq: Once | INTRAVENOUS | Status: AC
  Administered 2013-01-31: 1 g via INTRAVENOUS
  Filled 2013-01-31: qty 10

## 2013-01-31 MED ORDER — CEFPODOXIME PROXETIL 200 MG PO TABS: 200.0000 mg | ORAL_TABLET | Freq: Two times a day (BID) | ORAL | Status: DC

## 2013-01-31 MED ORDER — IOHEXOL 300 MG/ML  SOLN
100.0000 mL | Freq: Once | INTRAMUSCULAR | Status: AC | PRN
  Administered 2013-01-31: 100 mL via INTRAVENOUS

## 2013-01-31 NOTE — ED Notes (Signed)
rx x 1 given for vantin- d/c home with ride

## 2013-01-31 NOTE — ED Notes (Signed)
Pt c/o urinary frequency and pain with blood in urine since last night. Pt has hx of urinary stricture.

## 2013-01-31 NOTE — ED Notes (Signed)
MD at bedside. 

## 2013-01-31 NOTE — ED Provider Notes (Signed)
CSN: [id number]     Arrival date & time 01/31/13  1922 History     First MD Initiated Contact with Patient 01/31/13 1950     Chief Complaint  Patient presents with  . Dysuria   (Consider location/radiation/quality/duration/timing/severity/associated sxs/prior Treatment) Patient is a 57 y.o. male presenting with hematuria. The history is provided by the patient.  Hematuria This is a new problem. The current episode started yesterday. The problem has not changed since onset.Nothing aggravates the symptoms. Nothing relieves the symptoms. He has tried nothing for the symptoms.    Past Medical History  Diagnosis Date  . BPH (benign prostatic hyperplasia) 2012    biopsy this week with ? MD at [facility]  . Hyperlipidemia   . Diabetes mellitus    Past Surgical History  Procedure Laterality Date  . Tonsillectomy  1964   Family History  Problem Relation Age of Onset  . Hypertension Mother   . Prostate cancer Father   . Colon cancer Neg Hx   . Esophageal cancer Neg Hx   . Cancer Neg Hx   . Alcohol abuse Neg Hx   . Diabetes Neg Hx   . Drug abuse Neg Hx   . Early death Neg Hx   . Heart disease Neg Hx   . Hyperlipidemia Neg Hx   . Kidney disease Neg Hx   . Stroke Neg Hx   . Stomach cancer Maternal Grandfather    History  Substance Use Topics  . Smoking status: Never Smoker   . Smokeless tobacco: Never Used  . Alcohol Use: 3.0 oz/week    5 Cans of beer per week    Review of Systems  Constitutional: Negative for chills.  Gastrointestinal: Negative for constipation.  Genitourinary: Positive for dysuria, frequency and hematuria. Negative for decreased urine volume, discharge, penile swelling, scrotal swelling, difficulty urinating, penile pain and testicular pain.  Musculoskeletal: Positive for back pain.  All other systems reviewed and are negative.    Allergies  Review of patient's allergies indicates no known allergies.  Home Medications   Current Outpatient Rx   Name  Route  Sig  Dispense  Refill  . atorvastatin (LIPITOR) 40 MG tablet      TAKE ONE TABLET BY MOUTH EVERY DAY   90 tablet   2   . metFORMIN (GLUCOPHAGE) 500 MG tablet      TAKE ONE TABLET BY MOUTH TWICE DAILY WITH MEALS   180 tablet   1   . omeprazole (PRILOSEC) 20 MG capsule   Oral   Take 20 mg by mouth daily.            BP 133/90  Pulse 110  Temp(Src) 98.2 F (36.8 C) (Oral)  Resp 18  Ht 6\' 2"  (1.88 m)  Wt 235 lb (106.595 kg)  BMI 30.16 kg/m2 Physical Exam  Nursing note and vitals reviewed. Constitutional: He is oriented to person, place, and time. He appears well-developed and well-nourished.  HENT:  Head: Normocephalic and atraumatic.  Right Ear: External ear normal.  Left Ear: External ear normal.  Nose: Nose normal.  Eyes: Right eye exhibits no discharge. Left eye exhibits no discharge.  Neck: Neck supple.  Cardiovascular: Normal rate, regular rhythm, normal heart sounds and intact distal pulses.   Pulmonary/Chest: Effort normal and breath sounds normal.  Abdominal: Soft. There is tenderness in the suprapubic area.  Genitourinary: Rectum normal, prostate normal, testes normal and penis normal. Prostate is not enlarged and not tender. Right testis shows no swelling  and no tenderness. Left testis shows no swelling and no tenderness.  Neurological: He is alert and oriented to person, place, and time.  Skin: Skin is warm and dry.    ED Course   Procedures (including critical care time)  Labs Reviewed  URINALYSIS, ROUTINE W REFLEX MICROSCOPIC - Abnormal; Notable for the following:    Color, Urine BROWN (*)    APPearance CLOUDY (*)    Hgb urine dipstick LARGE (*)    Ketones, ur 15 (*)    Protein, ur >300 (*)    Nitrite POSITIVE (*)    Leukocytes, UA MODERATE (*)    All other components within normal limits  URINE MICROSCOPIC-ADD ON - Abnormal; Notable for the following:    Bacteria, UA FEW (*)    All other components within normal limits  CBC WITH  DIFFERENTIAL - Abnormal; Notable for the following:    WBC 16.1 (*)    Neutrophils Relative % 80 (*)    Neutro Abs 12.9 (*)    Lymphocytes Relative 10 (*)    Monocytes Absolute 1.5 (*)    All other components within normal limits  BASIC METABOLIC PANEL - Abnormal; Notable for the following:    Glucose, Bld 177 (*)    GFR calc non Af Amer 82 (*)    All other components within normal limits  URINE CULTURE  PROTIME-INR  APTT   Ct Abdomen Pelvis W Contrast  01/31/2013   *RADIOLOGY REPORT*  Clinical Data: Hematuria  CT ABDOMEN AND PELVIS WITH CONTRAST  Technique:  Multidetector CT imaging of the abdomen and pelvis was performed following the standard protocol during bolus administration of intravenous contrast.  Contrast: 50mL OMNIPAQUE IOHEXOL 300 MG/ML  SOLN, OMNIPAQUE IOHEXOL 300 MG/ML  SOLN  Comparison: None.  Findings: Curvilinear bibasilar dependent atelectasis noted.  Too small to characterize 6 mm right mid renal cortical hypodensity image 33.  3.3 cm left lower renal pole cortical cyst.  No hydronephrosis.  4 mm left lower renal pole too small to characterize hypodensity image 44.  No radiopaque renal or ureteral calculus.  Scattered colonic diverticuli noted without evidence for diverticulitis.  Bladder is normal.  The appendix is not identified but there is no secondary evidence for acute appendicitis.  No bowel wall thickening or focal segmental dilatation.  Liver, gallbladder, spleen, pancreas, and adrenal glands are normal.  No free air or fluid.  No lymphadenopathy.  Pleural-based calcification or right hepatic lobe granuloma image 25.  No acute osseous finding.  IMPRESSION: No acute intra-abdominal or pelvic pathology.   Original Report Authenticated By: Christiana Pellant, M.D.   1. Pyelonephritis     MDM  57 year old male with history of urethral stricture with hematuria and dysuria since yesterday. Labs consistent with a UTI. Postvoid residual shows no retention. Prostate is  nontender. Given his age with new hematuria a CT scan was obtained which is negative. Patient is able to take by mouth and feels well enough to go home. We'll treat with oral antibiotics for pyelogram and his pain in his back. Discussed her to return precautions, including vomiting or worsening pain or fevers. He will follow with PCP in a few days as well as set up appointment with his urologist for further checkup.  Audree Camel, MD 01/31/13 [phone]

## 2013-02-02 LAB — URINE CULTURE

## 2013-02-03 ENCOUNTER — Other Ambulatory Visit (INDEPENDENT_AMBULATORY_CARE_PROVIDER_SITE_OTHER): Payer: Managed Care, Other (non HMO)

## 2013-02-03 ENCOUNTER — Ambulatory Visit (INDEPENDENT_AMBULATORY_CARE_PROVIDER_SITE_OTHER): Payer: Managed Care, Other (non HMO) | Admitting: Internal Medicine

## 2013-02-03 ENCOUNTER — Encounter: Payer: Self-pay | Admitting: Internal Medicine

## 2013-02-03 VITALS — BP 140/88 | HR 73 | Temp 98.1°F | Resp 16 | Wt 234.0 lb

## 2013-02-03 DIAGNOSIS — IMO0001 Reserved for inherently not codable concepts without codable children: Secondary | ICD-10-CM

## 2013-02-03 DIAGNOSIS — N390 Urinary tract infection, site not specified: Secondary | ICD-10-CM

## 2013-02-03 DIAGNOSIS — E785 Hyperlipidemia, unspecified: Secondary | ICD-10-CM

## 2013-02-03 LAB — URINALYSIS, ROUTINE W REFLEX MICROSCOPIC
Bilirubin Urine: NEGATIVE
Nitrite: NEGATIVE
Urobilinogen, UA: 0.2 (ref 0.0–1.0)

## 2013-02-03 LAB — BASIC METABOLIC PANEL
BUN: 16 mg/dL (ref 6–23)
Calcium: 9.4 mg/dL (ref 8.4–10.5)
Creatinine, Ser: 1 mg/dL (ref 0.4–1.5)
GFR: 84.64 mL/min (ref >60.00)

## 2013-02-03 LAB — HM DIABETES FOOT EXAM

## 2013-02-03 NOTE — Patient Instructions (Signed)
Type 2 Diabetes Mellitus, Adult Type 2 diabetes mellitus, often simply referred to as type 2 diabetes, is a long-lasting (chronic) disease. In type 2 diabetes, the pancreas does not make enough insulin (a hormone), the cells are less responsive to the insulin that is made (insulin resistance), or both. Normally, insulin moves sugars from food into the tissue cells. The tissue cells use the sugars for energy. The lack of insulin or the lack of normal response to insulin causes excess sugars to build up in the blood instead of going into the tissue cells. As a result, high blood sugar (hyperglycemia) develops. The effect of high sugar (glucose) levels can cause many complications. Type 2 diabetes was also previously called adult-onset diabetes but it can occur at any age.  RISK FACTORS  A person is predisposed to developing type 2 diabetes if someone in the family has the disease and also has one or more of the following primary risk factors:  Overweight.  An inactive lifestyle.  A history of consistently eating high-calorie foods. Maintaining a normal weight and regular physical activity can reduce the chance of developing type 2 diabetes. SYMPTOMS  A person with type 2 diabetes may not show symptoms initially. The symptoms of type 2 diabetes appear slowly. The symptoms include:  Increased thirst (polydipsia).  Increased urination (polyuria).  Increased urination during the night (nocturia).  Weight loss. This weight loss may be rapid.  Frequent, recurring infections.  Tiredness (fatigue).  Weakness.  Vision changes, such as blurred vision.  Fruity smell to your breath.  Abdominal pain.  Nausea or vomiting.  Cuts or bruises which are slow to heal.  Tingling or numbness in the hands or feet. DIAGNOSIS Type 2 diabetes is frequently not diagnosed until complications of diabetes are present. Type 2 diabetes is diagnosed when symptoms or complications are present and when blood  glucose levels are increased. Your blood glucose level may be checked by one or more of the following blood tests:  A fasting blood glucose test. You will not be allowed to eat for at least 8 hours before a blood sample is taken.  A random blood glucose test. Your blood glucose is checked at any time of the day regardless of when you ate.  A hemoglobin A1c blood glucose test. A hemoglobin A1c test provides information about blood glucose control over the previous 3 months.  An oral glucose tolerance test (OGTT). Your blood glucose is measured after you have not eaten (fasted) for 2 hours and then after you drink a glucose-containing beverage. TREATMENT   You may need to take insulin or diabetes medicine daily to keep blood glucose levels in the desired range.  You will need to match insulin dosing with exercise and healthy food choices. The treatment goal is to maintain the before meal blood sugar (preprandial glucose) level at 70 130 mg/dL. HOME CARE INSTRUCTIONS   Have your hemoglobin A1c level checked twice a year.  Perform daily blood glucose monitoring as directed by your caregiver.  Monitor urine ketones when you are ill and as directed by your caregiver.  Take your diabetes medicine or insulin as directed by your caregiver to maintain your blood glucose levels in the desired range.  Never run out of diabetes medicine or insulin. It is needed every day.  Adjust insulin based on your intake of carbohydrates. Carbohydrates can raise blood glucose levels but need to be included in your diet. Carbohydrates provide vitamins, minerals, and fiber which are an essential part of   a healthy diet. Carbohydrates are found in fruits, vegetables, whole grains, dairy products, legumes, and foods containing added sugars.    Eat healthy foods. Alternate 3 meals with 3 snacks.  Lose weight if overweight.  Carry a medical alert card or wear your medical alert jewelry.  Carry a 15 gram  carbohydrate snack with you at all times to treat low blood glucose (hypoglycemia). Some examples of 15 gram carbohydrate snacks include:  Glucose tablets, 3 or 4   Glucose gel, 15 gram tube  Raisins, 2 tablespoons (24 grams)  Jelly beans, 6  Animal crackers, 8  Regular pop, 4 ounces (120 mL)  Gummy treats, 9  Recognize hypoglycemia. Hypoglycemia occurs with blood glucose levels of 70 mg/dL and below. The risk for hypoglycemia increases when fasting or skipping meals, during or after intense exercise, and during sleep. Hypoglycemia symptoms can include:  Tremors or shakes.  Decreased ability to concentrate.  Sweating.  Increased heart rate.  Headache.  Dry mouth.  Hunger.  Irritability.  Anxiety.  Restless sleep.  Altered speech or coordination.  Confusion.  Treat hypoglycemia promptly. If you are alert and able to safely swallow, follow the 15:15 rule:  Take 15 20 grams of rapid-acting glucose or carbohydrate. Rapid-acting options include glucose gel, glucose tablets, or 4 ounces (120 mL) of fruit juice, regular soda, or low fat milk.  Check your blood glucose level 15 minutes after taking the glucose.  Take 15 20 grams more of glucose if the repeat blood glucose level is still 70 mg/dL or below.  Eat a meal or snack within 1 hour once blood glucose levels return to normal.    Be alert to polyuria and polydipsia which are early signs of hyperglycemia. An early awareness of hyperglycemia allows for prompt treatment. Treat hyperglycemia as directed by your caregiver.  Engage in at least 150 minutes of moderate-intensity physical activity a week, spread over at least 3 days of the week or as directed by your caregiver. In addition, you should engage in resistance exercise at least 2 times a week or as directed by your caregiver.  Adjust your medicine and food intake as needed if you start a new exercise or sport.  Follow your sick day plan at any time you  are unable to eat or drink as usual.  Avoid tobacco use.  Limit alcohol intake to no more than 1 drink per day for nonpregnant women and 2 drinks per day for men. You should drink alcohol only when you are also eating food. Talk with your caregiver whether alcohol is safe for you. Tell your caregiver if you drink alcohol several times a week.  Follow up with your caregiver regularly.  Schedule an eye exam soon after the diagnosis of type 2 diabetes and then annually.  Perform daily skin and foot care. Examine your skin and feet daily for cuts, bruises, redness, nail problems, bleeding, blisters, or sores. A foot exam by a caregiver should be done annually.  Brush your teeth and gums at least twice a day and floss at least once a day. Follow up with your dentist regularly.  Share your diabetes management plan with your workplace or school.  Stay up-to-date with immunizations.  Learn to manage stress.  Obtain ongoing diabetes education and support as needed.  Participate in, or seek rehabilitation as needed to maintain or improve independence and quality of life. Request a physical or occupational therapy referral if you are having foot or hand numbness or difficulties with grooming,   dressing, eating, or physical activity. SEEK MEDICAL CARE IF:   You are unable to eat food or drink fluids for more than 6 hours.  You have nausea and vomiting for more than 6 hours.  Your blood glucose level is over 240 mg/dL.  There is a change in mental status.  You develop an additional serious illness.  You have diarrhea for more than 6 hours.  You have been sick or have had a fever for a couple of days and are not getting better.  You have pain during any physical activity.  SEEK IMMEDIATE MEDICAL CARE IF:  You have difficulty breathing.  You have moderate to large ketone levels. MAKE SURE YOU:  Understand these instructions.  Will watch your condition.  Will get help right away if  you are not doing well or get worse. Document Released: 06/08/2005 Document Revised: 03/02/2012 Document Reviewed: 01/05/2012 [employer] Patient Information 2014 [employer].  

## 2013-02-03 NOTE — Progress Notes (Signed)
Subjective:    Patient ID: Randy Rodriguez, male    DOB: [birth year], 57 y.o.   MRN: [id number]  Urinary Tract Infection  This is a new problem. The current episode started in the past 7 days. The problem occurs intermittently. The problem has been rapidly improving. The quality of the pain is described as burning. The pain is at a severity of 1/10. The patient is experiencing no pain. There has been no fever. The fever has been present for less than 1 day. He is sexually active. There is no history of pyelonephritis. Pertinent negatives include no chills, discharge, flank pain, frequency, hematuria, hesitancy, nausea, sweats, urgency or vomiting. He has tried antibiotics for the symptoms. The treatment provided significant relief.      Review of Systems  Constitutional: Negative.  Negative for fever, chills, diaphoresis, activity change, appetite change, fatigue and unexpected weight change.  HENT: Negative.   Eyes: Negative.   Respiratory: Negative.  Negative for cough, chest tightness, shortness of breath, wheezing and stridor.   Cardiovascular: Negative.  Negative for chest pain, palpitations and leg swelling.  Gastrointestinal: Negative.  Negative for nausea, vomiting, abdominal pain, diarrhea and constipation.  Endocrine: Negative.  Negative for polydipsia, polyphagia and polyuria.  Genitourinary: Positive for dysuria. Negative for hesitancy, urgency, frequency, hematuria, flank pain, decreased urine volume, enuresis, difficulty urinating and penile pain.  Musculoskeletal: Negative.  Negative for myalgias, back pain, joint swelling, arthralgias and gait problem.  Skin: Negative.   Allergic/Immunologic: Negative.   Neurological: Negative.   Hematological: Negative.  Negative for adenopathy. Does not bruise/bleed easily.  Psychiatric/Behavioral: Negative.        Objective:   Physical Exam  Vitals reviewed. Constitutional: He is oriented to person, place, and time. He appears  well-developed and well-nourished. No distress.  HENT:  Head: Normocephalic and atraumatic.  Mouth/Throat: Oropharynx is clear and moist. No oropharyngeal exudate.  Eyes: Conjunctivae are normal. Right eye exhibits no discharge. Left eye exhibits no discharge. No scleral icterus.  Neck: Normal range of motion. Neck supple. No JVD present. No tracheal deviation present. No thyromegaly present.  Cardiovascular: Normal rate, regular rhythm, normal heart sounds and intact distal pulses.  Exam reveals no gallop and no friction rub.   No murmur heard. Pulmonary/Chest: Effort normal and breath sounds normal. No stridor. No respiratory distress. He has no wheezes. He has no rales. He exhibits no tenderness.  Abdominal: Soft. Bowel sounds are normal. He exhibits no distension and no mass. There is no hepatosplenomegaly, splenomegaly or hepatomegaly. There is no tenderness. There is no rebound, no guarding and no CVA tenderness.  Musculoskeletal: Normal range of motion. He exhibits no edema and no tenderness.  Lymphadenopathy:    He has no cervical adenopathy.  Neurological: He is oriented to person, place, and time.  Skin: Skin is warm and dry. No rash noted. He is not diaphoretic. No erythema. No pallor.  Psychiatric: He has a normal mood and affect. His behavior is normal. Judgment and thought content normal.     Lab Results  Component Value Date   WBC 16.1* 01/31/2013   HGB 14.6 01/31/2013   HCT 42.6 01/31/2013   PLT 166 01/31/2013   GLUCOSE 177* 01/31/2013   CHOL 156 08/19/2012   TRIG 103.0 08/19/2012   HDL 41.60 08/19/2012   LDLCALC 94 08/19/2012   ALT 28 08/19/2012   AST 22 08/19/2012   NA 136 01/31/2013   K 3.9 01/31/2013   CL 99 01/31/2013   CREATININE 1.00 01/31/2013  BUN 12 01/31/2013   CO2 26 01/31/2013   TSH 0.71 08/19/2012   PSA 0.76 08/19/2012   INR 1.13 01/31/2013   HGBA1C 7.5* 08/19/2012   MICROALBUR 1.3 12/17/2011       Assessment & Plan:

## 2013-02-04 ENCOUNTER — Encounter: Payer: Self-pay | Admitting: Internal Medicine

## 2013-02-04 ENCOUNTER — Telehealth (HOSPITAL_COMMUNITY): Payer: Self-pay | Admitting: Emergency Medicine

## 2013-02-04 NOTE — ED Notes (Signed)
Post ED Visit - Positive Culture Follow-up  Culture report reviewed by antimicrobial stewardship pharmacist: []  Wes Dulaney, Pharm.D., BCPS [x]  Celedonio Miyamoto, [address].D., BCPS []  Georgina Pillion, Pharm.D., BCPS []  [place], [address].D., BCPS, AAHIVP []  Estella Husk, Pharm.D., BCPS, AAHIVP  Positive urine culture Treated with Cefpodoxime, organism sensitive to the same and no further patient follow-up is required at this time.  Randy Rodriguez 02/04/2013, 11:01 AM

## 2013-02-04 NOTE — Assessment & Plan Note (Signed)
His A1C is great No changes made today

## 2013-02-04 NOTE — Assessment & Plan Note (Signed)
His urine clx was positive for E. Coli He is doing well on Vantin

## 2013-02-04 NOTE — Assessment & Plan Note (Signed)
Goal achieved 

## 2013-02-05 ENCOUNTER — Encounter: Payer: Self-pay | Admitting: Internal Medicine

## 2013-02-05 LAB — CULTURE, URINE COMPREHENSIVE
Colony Count: NO GROWTH
Organism ID, Bacteria: NO GROWTH

## 2013-03-14 ENCOUNTER — Encounter: Payer: Self-pay | Admitting: Internal Medicine

## 2013-03-14 ENCOUNTER — Ambulatory Visit (INDEPENDENT_AMBULATORY_CARE_PROVIDER_SITE_OTHER): Payer: Managed Care, Other (non HMO) | Admitting: Internal Medicine

## 2013-03-14 ENCOUNTER — Other Ambulatory Visit (INDEPENDENT_AMBULATORY_CARE_PROVIDER_SITE_OTHER): Payer: Managed Care, Other (non HMO)

## 2013-03-14 VITALS — BP 120/80 | HR 72 | Temp 97.6°F | Resp 16 | Wt 235.0 lb

## 2013-03-14 DIAGNOSIS — R609 Edema, unspecified: Secondary | ICD-10-CM | POA: Insufficient documentation

## 2013-03-14 LAB — URINALYSIS, ROUTINE W REFLEX MICROSCOPIC
Ketones, ur: NEGATIVE
Leukocytes, UA: NEGATIVE
Specific Gravity, Urine: 1.02 (ref 1.000–1.030)
Urobilinogen, UA: 0.2 (ref 0.0–1.0)

## 2013-03-14 LAB — COMPREHENSIVE METABOLIC PANEL
AST: 18 U/L (ref 0–37)
Alkaline Phosphatase: 73 U/L (ref 39–117)
BUN: 16 mg/dL (ref 6–23)
Creatinine, Ser: 1 mg/dL (ref 0.4–1.5)
Total Bilirubin: 0.7 mg/dL (ref 0.3–1.2)

## 2013-03-14 LAB — CBC WITH DIFFERENTIAL/PLATELET
Basophils Relative: 0.3 % (ref 0.0–3.0)
Eosinophils Absolute: 0.2 10*3/uL (ref 0.0–0.7)
Hemoglobin: 15.2 g/dL (ref 13.0–17.0)
MCHC: 34.1 g/dL (ref 30.0–36.0)
MCV: 86.5 fl (ref 78.0–100.0)
Monocytes Absolute: 0.6 10*3/uL (ref 0.1–1.0)
Neutro Abs: 4.5 10*3/uL (ref 1.4–7.7)
Neutrophils Relative %: 59.9 % (ref 43.0–77.0)
RBC: 5.16 Mil/uL (ref 4.22–5.81)

## 2013-03-14 LAB — BRAIN NATRIURETIC PEPTIDE: Pro B Natriuretic peptide (BNP): 14 pg/mL (ref 0.0–100.0)

## 2013-03-14 NOTE — Patient Instructions (Signed)
Edema  Edema is an abnormal build-up of fluids in tissues. Because this is partly dependent on gravity (water flows to the lowest place), it is more common in the legs and thighs (lower extremities). It is also common in the looser tissues, like around the eyes. Painless swelling of the feet and ankles is common and increases as a person ages. It may affect both legs and may include the calves or even thighs. When squeezed, the fluid may move out of the affected area and may leave a dent for a few moments.  CAUSES    Prolonged standing or sitting in one place for extended periods of time. Movement helps pump tissue fluid into the veins, and absence of movement prevents this, resulting in edema.   Varicose veins. The valves in the veins do not work as well as they should. This causes fluid to leak into the tissues.   Fluid and salt overload.   Injury, burn, or surgery to the leg, ankle, or foot, may damage veins and allow fluid to leak out.   Sunburn damages vessels. Leaky vessels allow fluid to go out into the sunburned tissues.   Allergies (from insect bites or stings, medications or chemicals) cause swelling by allowing vessels to become leaky.   Protein in the blood helps keep fluid in your vessels. Low protein, as in malnutrition, allows fluid to leak out.   Hormonal changes, including pregnancy and menstruation, cause fluid retention. This fluid may leak out of vessels and cause edema.   Medications that cause fluid retention. Examples are sex hormones, blood pressure medications, steroid treatment, or anti-depressants.   Some illnesses cause edema, especially heart failure, kidney disease, or liver disease.   Surgery that cuts veins or lymph nodes, such as surgery done for the heart or for breast cancer, may result in edema.  DIAGNOSIS   Your caregiver is usually easily able to determine what is causing your swelling (edema) by simply asking what is wrong (getting a history) and examining you (doing  a physical). Sometimes x-rays, EKG (electrocardiogram or heart tracing), and blood work may be done to evaluate for underlying medical illness.  TREATMENT   General treatment includes:   Leg elevation (or elevation of the affected body part).   Restriction of fluid intake.   Prevention of fluid overload.   Compression of the affected body part. Compression with elastic bandages or support stockings squeezes the tissues, preventing fluid from entering and forcing it back into the blood vessels.   Diuretics (also called water pills or fluid pills) pull fluid out of your body in the form of increased urination. These are effective in reducing the swelling, but can have side effects and must be used only under your caregiver's supervision. Diuretics are appropriate only for some types of edema.  The specific treatment can be directed at any underlying causes discovered. Heart, liver, or kidney disease should be treated appropriately.  HOME CARE INSTRUCTIONS    Elevate the legs (or affected body part) above the level of the heart, while lying down.   Avoid sitting or standing still for prolonged periods of time.   Avoid putting anything directly under the knees when lying down, and do not wear constricting clothing or garters on the upper legs.   Exercising the legs causes the fluid to work back into the veins and lymphatic channels. This may help the swelling go down.   The pressure applied by elastic bandages or support stockings can help reduce ankle swelling.     A low-salt diet may help reduce fluid retention and decrease the ankle swelling.   Take any medications exactly as prescribed.  SEEK MEDICAL CARE IF:   Your edema is not responding to recommended treatments.  SEEK IMMEDIATE MEDICAL CARE IF:    You develop shortness of breath or chest pain.   You cannot breathe when you lay down; or if, while lying down, you have to get up and go to the window to get your breath.   You are having increasing  swelling without relief from treatment.   You develop a fever over 102 F (38.9 C).   You develop pain or redness in the areas that are swollen.   Tell your caregiver right away if you have gained 3 lb/1.4 kg in 1 day or 5 lb/2.3 kg in a week.  MAKE SURE YOU:    Understand these instructions.   Will watch your condition.   Will get help right away if you are not doing well or get worse.  Document Released: 06/08/2005 Document Revised: 12/08/2011 Document Reviewed: 01/25/2008  [employer] Patient Information 2014 [employer].

## 2013-03-14 NOTE — Progress Notes (Signed)
  Subjective:    Patient ID: Randy Rodriguez, male    DOB: 30-Oct-1955, 57 y.o.   MRN: [id number]  HPI  He complains of edema around his ankles for the last 3 weeks. The edema is worse after he has been up during the day and it gets better at night when he sleeps. He does take ibuprofen intermittently.  Review of Systems  Constitutional: Negative.  Negative for fever, chills, diaphoresis, activity change, appetite change, fatigue and unexpected weight change.  HENT: Negative.  Negative for facial swelling.   Eyes: Negative.   Respiratory: Negative.  Negative for apnea, cough, choking, chest tightness, shortness of breath, wheezing and stridor.   Cardiovascular: Negative.  Negative for chest pain, palpitations and leg swelling.  Gastrointestinal: Negative.  Negative for nausea, vomiting, abdominal pain, diarrhea and constipation.  Endocrine: Negative.   Genitourinary: Negative.   Musculoskeletal: Negative.   Skin: Negative.   Allergic/Immunologic: Negative.   Neurological: Negative.  Negative for dizziness, tremors, syncope, speech difficulty, weakness, light-headedness and numbness.  Hematological: Negative.  Negative for adenopathy. Does not bruise/bleed easily.  Psychiatric/Behavioral: Negative.        Objective:   Physical Exam  Vitals reviewed. Constitutional: He is oriented to person, place, and time. He appears well-developed and well-nourished. No distress.  HENT:  Head: Normocephalic and atraumatic.  Mouth/Throat: Oropharynx is clear and moist. No oropharyngeal exudate.  Eyes: Conjunctivae are normal. Right eye exhibits no discharge. Left eye exhibits no discharge. No scleral icterus.  Neck: Normal range of motion. Neck supple. No JVD present. No tracheal deviation present. No thyromegaly present.  Cardiovascular: Normal rate, regular rhythm, normal heart sounds and intact distal pulses.  Exam reveals no gallop and no friction rub.   No murmur heard. Pulmonary/Chest: Effort  normal and breath sounds normal. No stridor. No respiratory distress. He has no wheezes. He has no rales. He exhibits no tenderness.  Abdominal: Soft. Bowel sounds are normal. He exhibits no distension and no mass. There is no tenderness. There is no rebound and no guarding.  Musculoskeletal: Normal range of motion. He exhibits edema (1+ edema in B ankles). He exhibits no tenderness.  Lymphadenopathy:    He has no cervical adenopathy.  Neurological: He is oriented to person, place, and time.  Skin: Skin is warm and dry. No rash noted. He is not diaphoretic. No erythema. No pallor.  Psychiatric: He has a normal mood and affect. His behavior is normal. Judgment and thought content normal.    Lab Results  Component Value Date   WBC 16.1* 01/31/2013   HGB 14.6 01/31/2013   HCT 42.6 01/31/2013   PLT 166 01/31/2013   GLUCOSE 121* 02/03/2013   CHOL 156 08/19/2012   TRIG 103.0 08/19/2012   HDL 41.60 08/19/2012   LDLCALC 94 08/19/2012   ALT 28 08/19/2012   AST 22 08/19/2012   NA 138 02/03/2013   K 4.2 02/03/2013   CL 103 02/03/2013   CREATININE 1.0 02/03/2013   BUN 16 02/03/2013   CO2 30 02/03/2013   TSH 0.71 08/19/2012   PSA 0.76 08/19/2012   INR 1.13 01/31/2013   HGBA1C 7.1* 02/03/2013   MICROALBUR 1.3 12/17/2011        Assessment & Plan:

## 2013-03-15 ENCOUNTER — Encounter: Payer: Self-pay | Admitting: Internal Medicine

## 2013-03-15 NOTE — Assessment & Plan Note (Signed)
His EKG is normal There is no evidence of DVT (normal d-dimer) or CHF (normal BNP) The other labs are normal too (no proteinuria, anemia, normal albumin, thyroid, renal function) He will stop taking nsaids as this may be causing fluid retention He will buy some compression stockings for his legs and he will elevated his LE's as much as possible

## 2013-04-17 ENCOUNTER — Encounter: Payer: Self-pay | Admitting: Internal Medicine

## 2013-04-17 ENCOUNTER — Ambulatory Visit (INDEPENDENT_AMBULATORY_CARE_PROVIDER_SITE_OTHER): Payer: Managed Care, Other (non HMO) | Admitting: Internal Medicine

## 2013-04-17 VITALS — BP 120/82 | HR 72 | Temp 97.8°F | Resp 16 | Wt 237.0 lb

## 2013-04-17 DIAGNOSIS — IMO0002 Reserved for concepts with insufficient information to code with codable children: Secondary | ICD-10-CM

## 2013-04-17 DIAGNOSIS — L732 Hidradenitis suppurativa: Secondary | ICD-10-CM | POA: Insufficient documentation

## 2013-04-17 DIAGNOSIS — L02412 Cutaneous abscess of left axilla: Secondary | ICD-10-CM

## 2013-04-17 MED ORDER — DOXYCYCLINE HYCLATE 100 MG PO TABS: 100.0000 mg | ORAL_TABLET | Freq: Two times a day (BID) | ORAL | Status: DC

## 2013-04-17 NOTE — Assessment & Plan Note (Signed)
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

## 2013-04-17 NOTE — Progress Notes (Signed)
Subjective:    Patient ID: Randy Rodriguez, male    DOB: January 25, 1956, 57 y.o.   MRN: [id number]  HPI Comments: He returns complaining of a 2-3 week history of painful, draining cyst in his left axilla.  Wound Check He was originally treated more than 14 days ago. His temperature was unmeasured prior to arrival. There has been bloody discharge from the wound. The redness has not changed. The swelling has not changed. The pain has not changed. He has no difficulty moving the affected extremity or digit.      Review of Systems  Constitutional: Negative for fever, chills, diaphoresis, appetite change and fatigue.  HENT: Negative.   Eyes: Negative.   Respiratory: Negative.  Negative for cough, chest tightness, shortness of breath, wheezing and stridor.   Cardiovascular: Negative.  Negative for chest pain, palpitations and leg swelling.  Gastrointestinal: Negative.  Negative for nausea, vomiting, abdominal pain, constipation and blood in stool.  Endocrine: Negative.   Genitourinary: Negative.   Musculoskeletal: Negative.   Skin: Positive for wound. Negative for color change, pallor and rash.  Allergic/Immunologic: Negative.   Neurological: Negative.  Negative for dizziness, tremors and weakness.  Hematological: Negative for adenopathy. Does not bruise/bleed easily.  Psychiatric/Behavioral: Negative.        Objective:   Physical Exam  Vitals reviewed. Constitutional: He is oriented to person, place, and time. He appears well-developed and well-nourished.  Non-toxic appearance. He does not have a sickly appearance. He does not appear ill. No distress.  HENT:  Head: Normocephalic and atraumatic.  Mouth/Throat: Oropharynx is clear and moist. No oropharyngeal exudate.  Eyes: Conjunctivae are normal. Right eye exhibits no discharge. Left eye exhibits no discharge. No scleral icterus.  Neck: Normal range of motion. Neck supple. No JVD present. No tracheal deviation present. No thyromegaly present.   Cardiovascular: Normal rate, regular rhythm, normal heart sounds and intact distal pulses.  Exam reveals no gallop and no friction rub.   No murmur heard. Pulmonary/Chest: Effort normal and breath sounds normal. No stridor. No respiratory distress. He has no wheezes. He has no rales. He exhibits no tenderness.  Abdominal: Soft. Bowel sounds are normal. He exhibits no distension and no mass. There is no tenderness. There is no rebound and no guarding.  Musculoskeletal: Normal range of motion. He exhibits no edema and no tenderness.  Left axilla shows a 2 x 3 cm area of warmth, induration, erythema, fluctuance, and tenderness.  Lymphadenopathy:    He has no cervical adenopathy.  Neurological: He is oriented to person, place, and time.  Skin: Skin is warm and dry. No rash noted. He is not diaphoretic. No erythema. No pallor.  The left axilla was cleaned with betadine then prepped and draped in sterile fashion. Local anesthesia was obtained with 2% lido with epi. 2.5 cc were used. Then a 4 mm punch incision was made and a large loculation was seen and disrupted. A clx was sent. The cavity was cleaned with H2O2 and packed with iodoform. He tolerated this well. A dressing was applied.     Lab Results  Component Value Date   WBC 7.5 03/14/2013   HGB 15.2 03/14/2013   HCT 44.6 03/14/2013   PLT 256.0 03/14/2013   GLUCOSE 126* 03/14/2013   CHOL 156 08/19/2012   TRIG 103.0 08/19/2012   HDL 41.60 08/19/2012   LDLCALC 94 08/19/2012   ALT 20 03/14/2013   AST 18 03/14/2013   NA 139 03/14/2013   K 4.6 03/14/2013   CL 101  03/14/2013   CREATININE 1.0 03/14/2013   BUN 16 03/14/2013   CO2 31 03/14/2013   TSH 0.69 03/14/2013   PSA 0.76 08/19/2012   INR 1.13 01/31/2013   HGBA1C 7.1* 02/03/2013   MICROALBUR 1.3 12/17/2011       Assessment & Plan:

## 2013-04-17 NOTE — Patient Instructions (Signed)

## 2013-04-18 ENCOUNTER — Other Ambulatory Visit: Payer: Managed Care, Other (non HMO)

## 2013-04-18 DIAGNOSIS — L02412 Cutaneous abscess of left axilla: Secondary | ICD-10-CM

## 2013-04-19 ENCOUNTER — Ambulatory Visit (INDEPENDENT_AMBULATORY_CARE_PROVIDER_SITE_OTHER): Payer: Managed Care, Other (non HMO) | Admitting: Internal Medicine

## 2013-04-19 ENCOUNTER — Encounter: Payer: Self-pay | Admitting: Internal Medicine

## 2013-04-19 VITALS — BP 120/82 | HR 72 | Temp 97.8°F | Resp 16

## 2013-04-19 DIAGNOSIS — L732 Hidradenitis suppurativa: Secondary | ICD-10-CM

## 2013-04-19 MED ORDER — SAXAGLIPTIN HCL 5 MG PO TABS: 5.0000 mg | ORAL_TABLET | Freq: Every day | ORAL | Status: DC

## 2013-04-19 NOTE — Assessment & Plan Note (Signed)
The clx was negative I have asked him to continue taking doxycycline

## 2013-04-19 NOTE — Progress Notes (Signed)
  Subjective:    Patient ID: Randy Rodriguez, male    DOB: 1955-09-05, 57 y.o.   MRN: [id number]  Wound Check He was originally treated 2 to 3 days ago. Previous treatment included I&D of abscess and oral antibiotics. His temperature was unmeasured prior to arrival. There has been no drainage from the wound. There is no redness present. There is no swelling present. The pain has no pain. He has no difficulty moving the affected extremity or digit.      Review of Systems  Constitutional: Negative.  Negative for fever, chills, diaphoresis, appetite change and fatigue.  HENT: Negative.   Eyes: Negative.   Respiratory: Negative.  Negative for cough, chest tightness, shortness of breath, wheezing and stridor.   Cardiovascular: Negative.  Negative for chest pain, palpitations and leg swelling.  Gastrointestinal: Negative.  Negative for nausea, vomiting, abdominal pain, diarrhea, constipation and blood in stool.  Endocrine: Negative.  Negative for polydipsia, polyphagia and polyuria.  Genitourinary: Negative.   Musculoskeletal: Negative.   Skin: Negative.   Allergic/Immunologic: Negative.   Neurological: Negative.  Negative for tremors, weakness, light-headedness and numbness.  Hematological: Negative.  Negative for adenopathy. Does not bruise/bleed easily.  Psychiatric/Behavioral: Negative.        Objective:   Physical Exam  Vitals reviewed. Constitutional: He is oriented to person, place, and time. He appears well-developed and well-nourished. No distress.  HENT:  Head: Normocephalic and atraumatic.  Mouth/Throat: Oropharynx is clear and moist. No oropharyngeal exudate.  Eyes: Conjunctivae are normal. Right eye exhibits no discharge. Left eye exhibits no discharge. No scleral icterus.  Neck: Normal range of motion. Neck supple. No JVD present. No tracheal deviation present. No thyromegaly present.  Cardiovascular: Normal rate, regular rhythm, normal heart sounds and intact distal pulses.   Exam reveals no gallop and no friction rub.   No murmur heard. Pulmonary/Chest: Effort normal and breath sounds normal. No stridor. No respiratory distress. He has no wheezes. He has no rales. He exhibits no tenderness.  Abdominal: Soft. Bowel sounds are normal. He exhibits no distension and no mass. There is no tenderness. There is no rebound and no guarding.  Musculoskeletal: Normal range of motion. He exhibits no edema and no tenderness.  The left axilla was examined and the packing was removed. There is no more exudate and no loculations. There is a very/mild persistent induration but no warmth, fluctuance, streaking. No additional treatment was offered.  Lymphadenopathy:    He has no cervical adenopathy.  Neurological: He is oriented to person, place, and time.  Skin: Skin is warm and dry. No rash noted. He is not diaphoretic. No erythema. No pallor.     Lab Results  Component Value Date   WBC 7.5 03/14/2013   HGB 15.2 03/14/2013   HCT 44.6 03/14/2013   PLT 256.0 03/14/2013   GLUCOSE 126* 03/14/2013   CHOL 156 08/19/2012   TRIG 103.0 08/19/2012   HDL 41.60 08/19/2012   LDLCALC 94 08/19/2012   ALT 20 03/14/2013   AST 18 03/14/2013   NA 139 03/14/2013   K 4.6 03/14/2013   CL 101 03/14/2013   CREATININE 1.0 03/14/2013   BUN 16 03/14/2013   CO2 31 03/14/2013   TSH 0.69 03/14/2013   PSA 0.76 08/19/2012   INR 1.13 01/31/2013   HGBA1C 7.1* 02/03/2013   MICROALBUR 1.3 12/17/2011       Assessment & Plan:

## 2013-04-19 NOTE — Assessment & Plan Note (Signed)
He wants to get better blood sugar control so I have asked him to restart onglyza in addition to the metformin

## 2013-04-19 NOTE — Patient Instructions (Signed)
Hidradenitis Suppurativa, Sweat Gland Abscess °Hidradenitis suppurativa is a long lasting (chronic), uncommon disease of the sweat glands. With this, boil-like lumps and scarring develop in the groin, some times under the arms (axillae), and under the breasts. It may also uncommonly occur behind the ears, in the crease of the buttocks, and around the genitals.  °CAUSES  °The cause is from a blocking of the sweat glands. They then become infected. It may cause drainage and odor. It is not contagious. So it cannot be given to someone else. It most often shows up in puberty (about 10 to 57 years of age). But it may happen much later. It is similar to acne which is a disease of the sweat glands. This condition is slightly more common in African-Americans and women. °SYMPTOMS  °· Hidradenitis usually starts as one or more red, tender, swellings in the groin or under the arms (axilla). °· Over a period of hours to days the lesions get larger. They often open to the skin surface, draining clear to yellow-colored fluid. °· The infected area heals with scarring. °DIAGNOSIS  °Your caregiver makes this diagnosis by looking at you. Sometimes cultures (growing germs on plates in the lab) may be taken. This is to see what germ (bacterium) is causing the infection.  °TREATMENT  °· Topical germ killing medicine applied to the skin (antibiotics) are the treatment of choice. Antibiotics taken by mouth (systemic) are sometimes needed when the condition is getting worse or is severe. °· Avoid tight-fitting clothing which traps moisture in. °· Dirt does not cause hidradenitis and it is not caused by poor hygiene. °· Involved areas should be cleaned daily using an antibacterial soap. Some patients find that the liquid form of Lever 2000®, applied to the involved areas as a lotion after bathing, can help reduce the odor related to this condition. °· Sometimes surgery is needed to drain infected areas or remove scarred tissue. Removal of  large amounts of tissue is used only in severe cases. °· Birth control pills may be helpful. °· Oral retinoids (vitamin A derivatives) for 6 to 12 months which are effective for acne may also help this condition. °· Weight loss will improve but not cure hidradenitis. It is made worse by being overweight. But the condition is not caused by being overweight. °· This condition is more common in people who have had acne. °· It may become worse under stress. °There is no medical cure for hidradenitis. It can be controlled, but not cured. The condition usually continues for years with periods of getting worse and getting better (remission). °Document Released: 01/21/2004 Document Revised: 08/31/2011 Document Reviewed: 02/06/2008 °[employer]® Patient Information ©2014 [employer]. ° °

## 2013-04-21 ENCOUNTER — Encounter: Payer: Self-pay | Admitting: Internal Medicine

## 2013-04-21 LAB — WOUND CULTURE
Gram Stain: NONE SEEN
Gram Stain: NONE SEEN

## 2013-06-28 ENCOUNTER — Ambulatory Visit (INDEPENDENT_AMBULATORY_CARE_PROVIDER_SITE_OTHER): Payer: Managed Care, Other (non HMO) | Admitting: Internal Medicine

## 2013-06-28 ENCOUNTER — Encounter: Payer: Self-pay | Admitting: Internal Medicine

## 2013-06-28 VITALS — BP 140/88 | HR 72 | Temp 97.8°F | Resp 16 | Ht 74.0 in | Wt 240.0 lb

## 2013-06-28 DIAGNOSIS — L02412 Cutaneous abscess of left axilla: Secondary | ICD-10-CM

## 2013-06-28 DIAGNOSIS — Z23 Encounter for immunization: Secondary | ICD-10-CM

## 2013-06-28 DIAGNOSIS — L732 Hidradenitis suppurativa: Secondary | ICD-10-CM

## 2013-06-28 DIAGNOSIS — IMO0002 Reserved for concepts with insufficient information to code with codable children: Secondary | ICD-10-CM

## 2013-06-28 MED ORDER — DOXYCYCLINE HYCLATE 100 MG PO TABS: 100.0000 mg | ORAL_TABLET | Freq: Two times a day (BID) | ORAL | Status: DC

## 2013-06-28 NOTE — Patient Instructions (Signed)
Hidradenitis Suppurativa, Sweat Gland Abscess °Hidradenitis suppurativa is a long lasting (chronic), uncommon disease of the sweat glands. With this, boil-like lumps and scarring develop in the groin, some times under the arms (axillae), and under the breasts. It may also uncommonly occur behind the ears, in the crease of the buttocks, and around the genitals.  °CAUSES  °The cause is from a blocking of the sweat glands. They then become infected. It may cause drainage and odor. It is not contagious. So it cannot be given to someone else. It most often shows up in puberty (about 10 to 58 years of age). But it may happen much later. It is similar to acne which is a disease of the sweat glands. This condition is slightly more common in African-Americans and women. °SYMPTOMS  °· Hidradenitis usually starts as one or more red, tender, swellings in the groin or under the arms (axilla). °· Over a period of hours to days the lesions get larger. They often open to the skin surface, draining clear to yellow-colored fluid. °· The infected area heals with scarring. °DIAGNOSIS  °Your caregiver makes this diagnosis by looking at you. Sometimes cultures (growing germs on plates in the lab) may be taken. This is to see what germ (bacterium) is causing the infection.  °TREATMENT  °· Topical germ killing medicine applied to the skin (antibiotics) are the treatment of choice. Antibiotics taken by mouth (systemic) are sometimes needed when the condition is getting worse or is severe. °· Avoid tight-fitting clothing which traps moisture in. °· Dirt does not cause hidradenitis and it is not caused by poor hygiene. °· Involved areas should be cleaned daily using an antibacterial soap. Some patients find that the liquid form of Lever 2000®, applied to the involved areas as a lotion after bathing, can help reduce the odor related to this condition. °· Sometimes surgery is needed to drain infected areas or remove scarred tissue. Removal of  large amounts of tissue is used only in severe cases. °· Birth control pills may be helpful. °· Oral retinoids (vitamin A derivatives) for 6 to 12 months which are effective for acne may also help this condition. °· Weight loss will improve but not cure hidradenitis. It is made worse by being overweight. But the condition is not caused by being overweight. °· This condition is more common in people who have had acne. °· It may become worse under stress. °There is no medical cure for hidradenitis. It can be controlled, but not cured. The condition usually continues for years with periods of getting worse and getting better (remission). °Document Released: 01/21/2004 Document Revised: 08/31/2011 Document Reviewed: 02/06/2008 °[employer]® Patient Information ©2014 [employer]. ° °

## 2013-06-28 NOTE — Progress Notes (Signed)
   Subjective:    Patient ID: Randy Rodriguez, male    DOB: [birth year], 58 y.o.   MRN: [id number]  HPI Comments: For the last week he has had a small red swollen area in his left axilla.     Review of Systems  Constitutional: Negative.  Negative for fever, chills, diaphoresis, appetite change and fatigue.  HENT: Negative.   Eyes: Negative.   Respiratory: Negative.  Negative for cough, choking, chest tightness, shortness of breath, wheezing and stridor.   Cardiovascular: Negative.  Negative for chest pain, palpitations and leg swelling.  Gastrointestinal: Negative.  Negative for abdominal pain.  Endocrine: Negative.   Genitourinary: Negative.   Musculoskeletal: Negative.   Skin: Positive for color change. Negative for pallor, rash and wound.  Allergic/Immunologic: Negative.   Hematological: Negative.  Negative for adenopathy. Does not bruise/bleed easily.  Psychiatric/Behavioral: Negative.        Objective:   Physical Exam  Vitals reviewed. Constitutional: He appears well-developed and well-nourished. No distress.  HENT:  Head: Normocephalic and atraumatic.  Mouth/Throat: Oropharynx is clear and moist. No oropharyngeal exudate.  Eyes: Conjunctivae are normal. Right eye exhibits no discharge. Left eye exhibits no discharge. No scleral icterus.  Neck: Normal range of motion. Neck supple. No JVD present. No tracheal deviation present. No thyromegaly present.  Cardiovascular: Normal rate, regular rhythm, normal heart sounds and intact distal pulses.  Exam reveals no gallop and no friction rub.   No murmur heard. Pulmonary/Chest: Effort normal and breath sounds normal. No stridor. No respiratory distress. He has no wheezes. He has no rales. He exhibits no tenderness.  Abdominal: Soft. Bowel sounds are normal. He exhibits no distension and no mass. There is no tenderness. There is no rebound and no guarding.  Musculoskeletal: Normal range of motion. He exhibits no edema.  Lymphadenopathy:      He has no cervical adenopathy.  Skin: Skin is warm and dry. No abrasion, no bruising, no burn, no ecchymosis, no laceration, no lesion, no petechiae and no rash noted. He is not diaphoretic. There is erythema.  In the left axilla there are a few pits and over the upper lateral area there is a 1 cm sub Q cyst that feels indurated and the skin is erythematous but there is no fluctuance or streaking. I don't feel a formed abscess.  Psychiatric: He has a normal mood and affect. His behavior is normal. Judgment and thought content normal.     Lab Results  Component Value Date   WBC 7.5 03/14/2013   HGB 15.2 03/14/2013   HCT 44.6 03/14/2013   PLT 256.0 03/14/2013   GLUCOSE 126* 03/14/2013   CHOL 156 08/19/2012   TRIG 103.0 08/19/2012   HDL 41.60 08/19/2012   LDLCALC 94 08/19/2012   ALT 20 03/14/2013   AST 18 03/14/2013   NA 139 03/14/2013   K 4.6 03/14/2013   CL 101 03/14/2013   CREATININE 1.0 03/14/2013   BUN 16 03/14/2013   CO2 31 03/14/2013   TSH 0.69 03/14/2013   PSA 0.76 08/19/2012   INR 1.13 01/31/2013   HGBA1C 7.1* 02/03/2013   MICROALBUR 1.3 12/17/2011       Assessment & Plan:

## 2013-06-28 NOTE — Progress Notes (Signed)
Pre visit review using our clinic review tool, if applicable. No additional management support is needed unless otherwise documented below in the visit note. 

## 2013-06-30 ENCOUNTER — Encounter: Payer: Self-pay | Admitting: Internal Medicine

## 2013-06-30 NOTE — Assessment & Plan Note (Signed)
I don't think an I and D is indicated I have asked him to start doxycycline

## 2013-10-02 ENCOUNTER — Other Ambulatory Visit: Payer: Self-pay | Admitting: Internal Medicine

## 2013-11-26 IMAGING — CT CT ABD-PELV W/ CM
2 of 5 series · 17 of 46 positions shown, 19 images · IV contrast (APPLIED)
Comparison: None.

CLINICAL DATA: Hematuria

CT ABDOMEN AND PELVIS WITH CONTRAST
TECHNIQUE: Multidetector CT imaging of the abdomen and pelvis was
performed following the standard protocol during bolus
administration of intravenous contrast.
Contrast: 50mL OMNIPAQUE IOHEXOL 300 MG/ML  SOLN, 100mL OMNIPAQUE
IOHEXOL 300 MG/ML  SOLN

[Series 2: abd/pelvis 5.0 b31f · axial · 0.85mm/px · z∈[+820,+1260]mm · 14 of 100 slices shown, 16 images]
[im 6/100  soft-tissue]
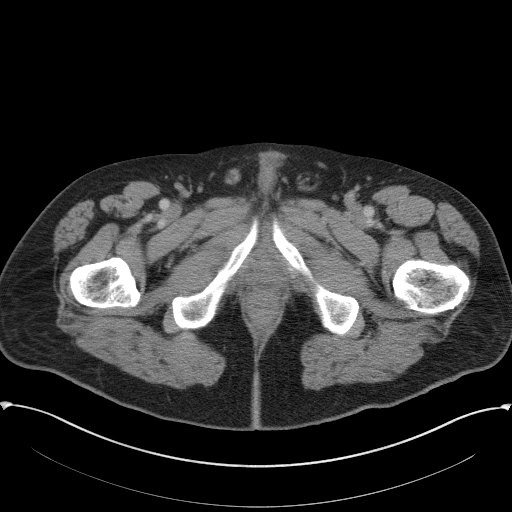
[im 6/100  bone]
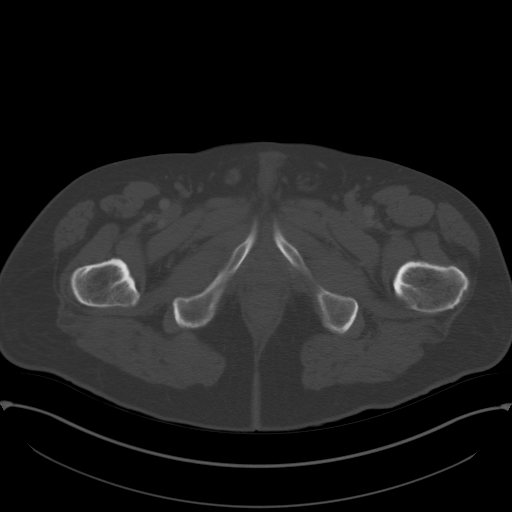
[im 12/100  soft-tissue]
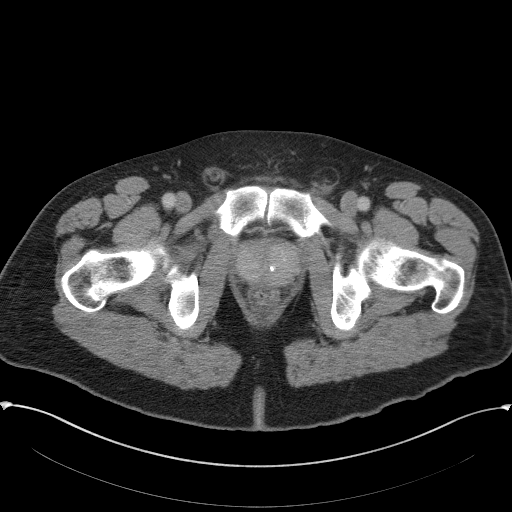
[im 23/100  soft-tissue]
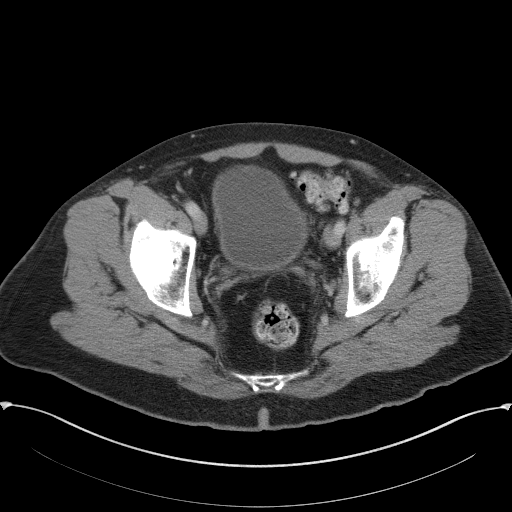
[im 28/100  soft-tissue]
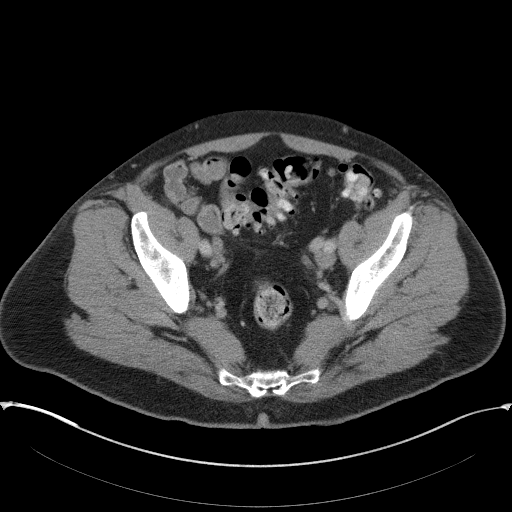
[im 34/100  soft-tissue]
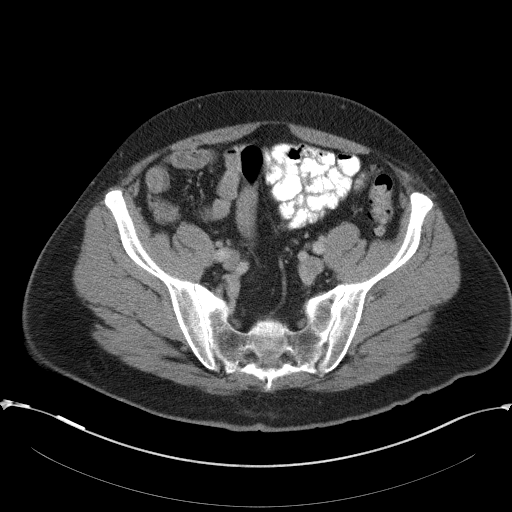
[im 39/100  soft-tissue]
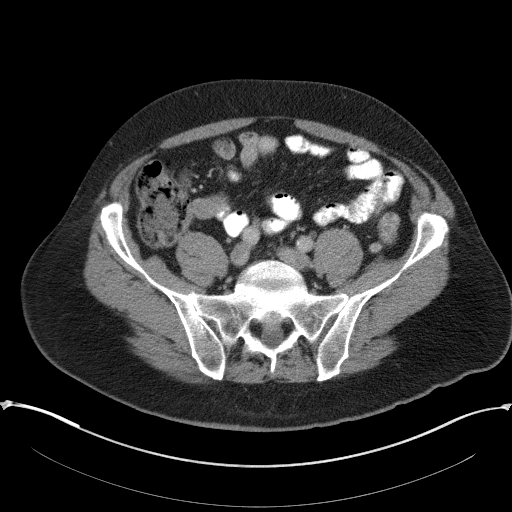
[im 45/100  soft-tissue]
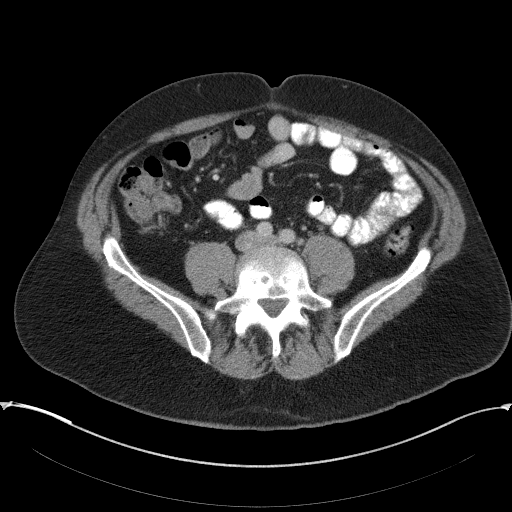
[im 56/100  soft-tissue]
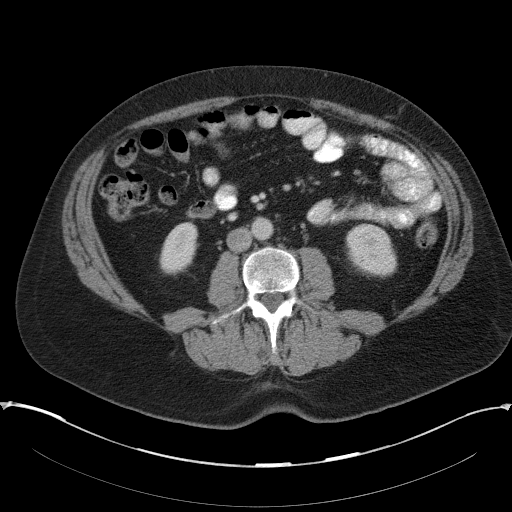
[im 61/100  soft-tissue]
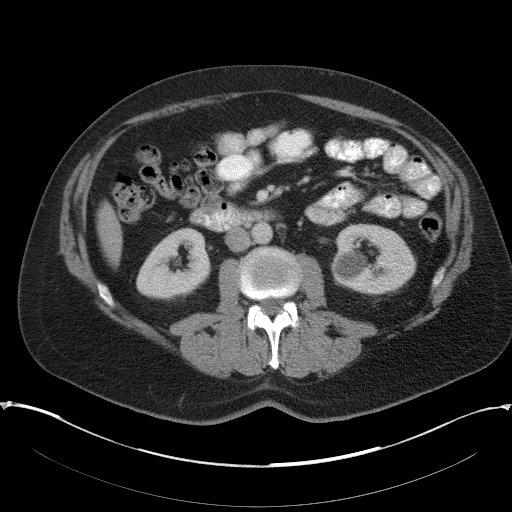
[im 61/100  bone]
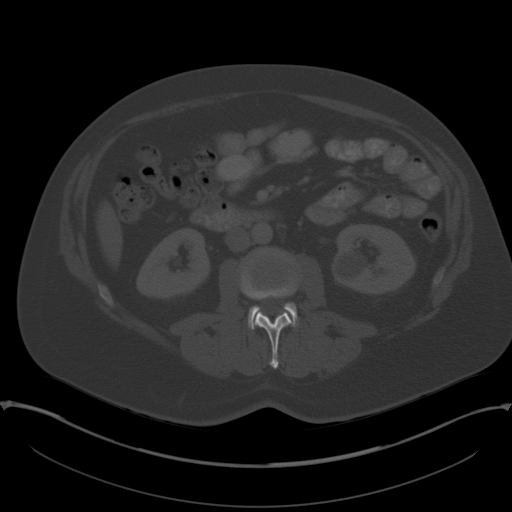
[im 67/100  soft-tissue]
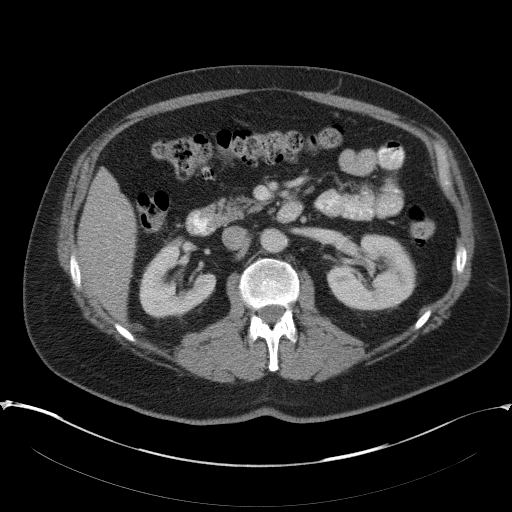
[im 72/100  soft-tissue]
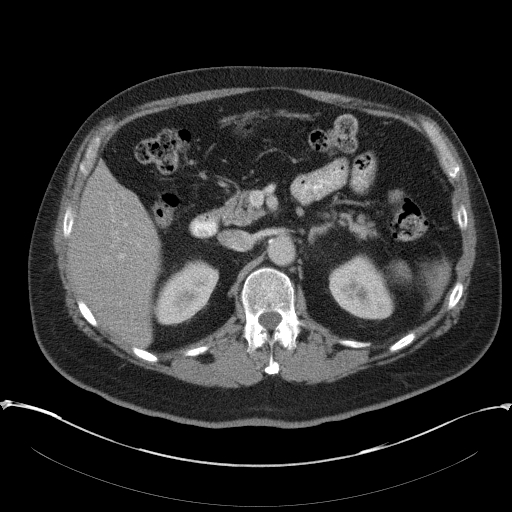
[im 78/100  soft-tissue]
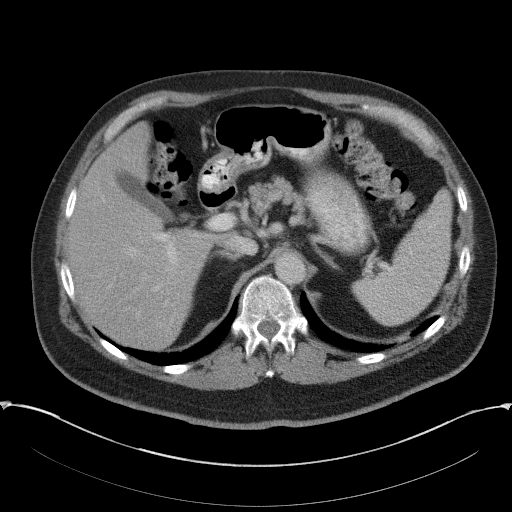
[im 89/100  soft-tissue]
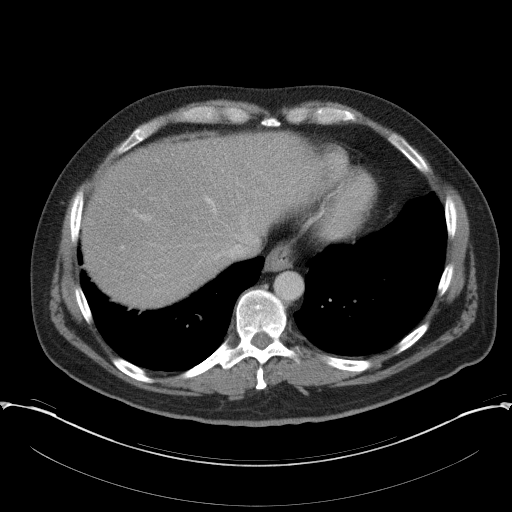
[im 94/100  soft-tissue]
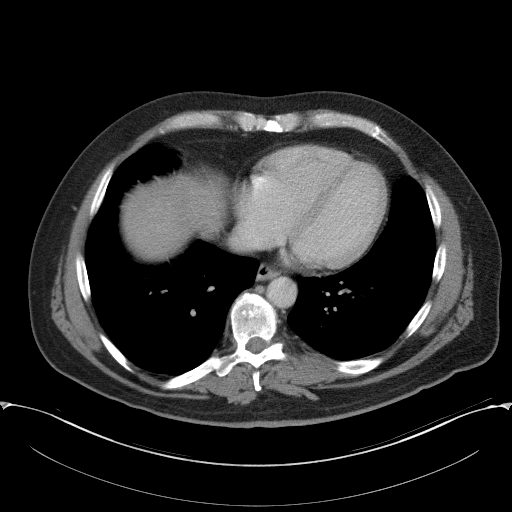

[Series 5: abd/pelvis 3.0 coronal · coronal · 0.86mm/px · 3 of 103 slices shown]
[im 35/103  soft-tissue]
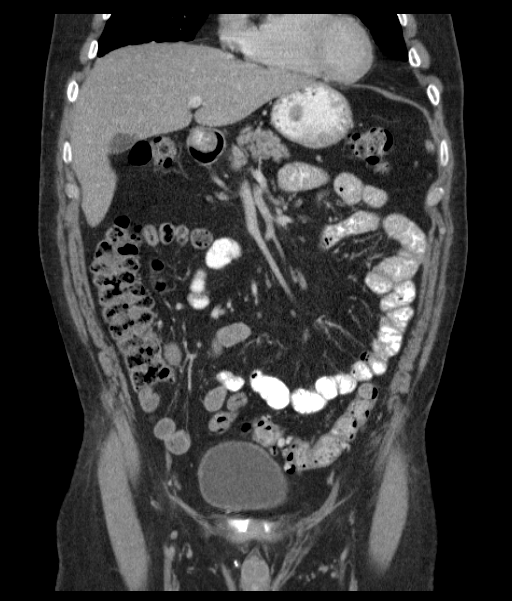
[im 46/103  soft-tissue]
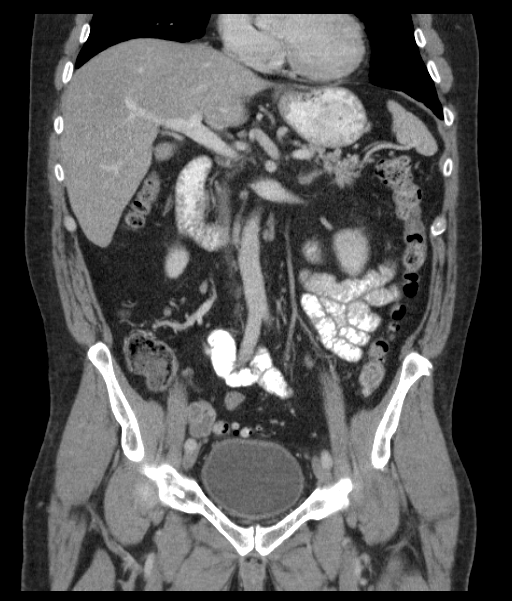
[im 57/103  soft-tissue]
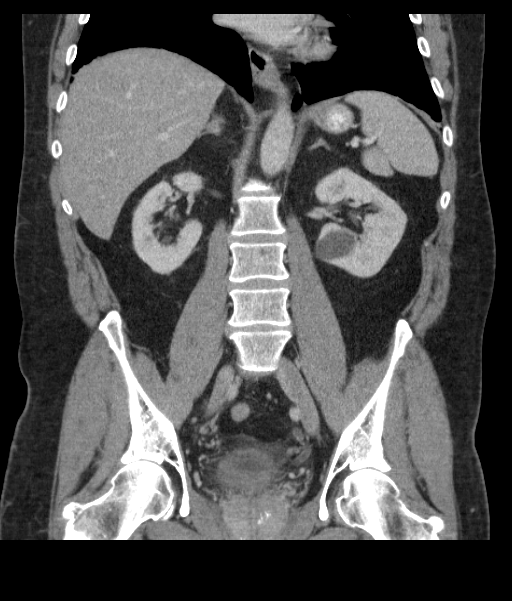

[17 of 46 positions shown; findings below may reference images not displayed]

FINDINGS: Curvilinear bibasilar dependent atelectasis noted.

Too small to characterize 6 mm right mid renal cortical hypodensity
image 33.  3.3 cm left lower renal pole cortical cyst.  No
hydronephrosis.  4 mm left lower renal pole too small to
characterize hypodensity image 44.  No radiopaque renal or ureteral
calculus.

Scattered colonic diverticuli noted without evidence for
diverticulitis.  Bladder is normal.  The appendix is not identified
but there is no secondary evidence for acute appendicitis.  No
bowel wall thickening or focal segmental dilatation.  Liver,
gallbladder, spleen, pancreas, and adrenal glands are normal.

No free air or fluid.  No lymphadenopathy.  Pleural-based
calcification or right hepatic lobe granuloma image 25.  No acute
osseous finding.
IMPRESSION: No acute intra-abdominal or pelvic pathology.

## 2013-12-27 ENCOUNTER — Telehealth: Payer: Self-pay | Admitting: *Deleted

## 2013-12-27 DIAGNOSIS — E1165 Type 2 diabetes mellitus with hyperglycemia: Principal | ICD-10-CM

## 2013-12-27 DIAGNOSIS — IMO0001 Reserved for inherently not codable concepts without codable children: Secondary | ICD-10-CM

## 2013-12-27 NOTE — Telephone Encounter (Signed)
Left message on machine for patient to schedule an appointment for blood pressure, diabetes, cholesterol. Lipid, a1c, bmet ordered Message sent to mychart Diabetic bundle

## 2014-01-22 ENCOUNTER — Encounter: Payer: Self-pay | Admitting: Internal Medicine

## 2014-01-22 ENCOUNTER — Ambulatory Visit (INDEPENDENT_AMBULATORY_CARE_PROVIDER_SITE_OTHER): Payer: Managed Care, Other (non HMO) | Admitting: Internal Medicine

## 2014-01-22 ENCOUNTER — Other Ambulatory Visit (INDEPENDENT_AMBULATORY_CARE_PROVIDER_SITE_OTHER): Payer: Managed Care, Other (non HMO)

## 2014-01-22 VITALS — BP 118/78 | HR 76 | Temp 97.8°F | Resp 16 | Ht 74.0 in | Wt 244.0 lb

## 2014-01-22 DIAGNOSIS — Z0000 Encounter for general adult medical examination without abnormal findings: Secondary | ICD-10-CM

## 2014-01-22 DIAGNOSIS — E1165 Type 2 diabetes mellitus with hyperglycemia: Principal | ICD-10-CM

## 2014-01-22 DIAGNOSIS — IMO0001 Reserved for inherently not codable concepts without codable children: Secondary | ICD-10-CM

## 2014-01-22 DIAGNOSIS — E785 Hyperlipidemia, unspecified: Secondary | ICD-10-CM

## 2014-01-22 LAB — URINALYSIS, ROUTINE W REFLEX MICROSCOPIC
Bilirubin Urine: NEGATIVE
HGB URINE DIPSTICK: NEGATIVE
Ketones, ur: NEGATIVE
LEUKOCYTES UA: NEGATIVE
NITRITE: NEGATIVE
PH: 6 (ref 5.0–8.0)
RBC / HPF: NONE SEEN (ref 0–?)
Specific Gravity, Urine: 1.02 (ref 1.000–1.030)
TOTAL PROTEIN, URINE-UPE24: NEGATIVE
Urine Glucose: 100 — AB
Urobilinogen, UA: 0.2 (ref 0.0–1.0)

## 2014-01-22 LAB — COMPREHENSIVE METABOLIC PANEL
ALK PHOS: 62 U/L (ref 39–117)
ALT: 25 U/L (ref 0–53)
AST: 23 U/L (ref 0–37)
Albumin: 4.1 g/dL (ref 3.5–5.2)
BUN: 16 mg/dL (ref 6–23)
CALCIUM: 9.5 mg/dL (ref 8.4–10.5)
CO2: 29 mEq/L (ref 19–32)
Chloride: 100 mEq/L (ref 96–112)
Creatinine, Ser: 0.9 mg/dL (ref 0.4–1.5)
GFR: 88.55 mL/min (ref >60.00)
Glucose, Bld: 140 mg/dL — ABNORMAL HIGH (ref 70–99)
Potassium: 4.7 mEq/L (ref 3.5–5.1)
SODIUM: 135 meq/L (ref 135–145)
TOTAL PROTEIN: 7.3 g/dL (ref 6.0–8.3)
Total Bilirubin: 0.7 mg/dL (ref 0.2–1.2)

## 2014-01-22 LAB — CBC WITH DIFFERENTIAL/PLATELET
Basophils Absolute: 0 10*3/uL (ref 0.0–0.1)
Basophils Relative: 0.3 % (ref 0.0–3.0)
EOS PCT: 3.5 % (ref 0.0–5.0)
Eosinophils Absolute: 0.2 10*3/uL (ref 0.0–0.7)
HCT: 46.6 % (ref 39.0–52.0)
Hemoglobin: 15.7 g/dL (ref 13.0–17.0)
Lymphocytes Relative: 29.3 % (ref 12.0–46.0)
Lymphs Abs: 1.8 10*3/uL (ref 0.7–4.0)
MCHC: 33.7 g/dL (ref 30.0–36.0)
MCV: 88.2 fl (ref 78.0–100.0)
MONOS PCT: 12.5 % — AB (ref 3.0–12.0)
Monocytes Absolute: 0.8 10*3/uL (ref 0.1–1.0)
NEUTROS ABS: 3.4 10*3/uL (ref 1.4–7.7)
NEUTROS PCT: 54.4 % (ref 43.0–77.0)
PLATELETS: 205 10*3/uL (ref 150.0–400.0)
RBC: 5.28 Mil/uL (ref 4.22–5.81)
RDW: 13.3 % (ref 11.5–15.5)
WBC: 6.2 10*3/uL (ref 4.0–10.5)

## 2014-01-22 LAB — PSA: PSA: 0.84 ng/mL (ref 0.10–4.00)

## 2014-01-22 LAB — LIPID PANEL
CHOL/HDL RATIO: 4
Cholesterol: 154 mg/dL (ref 0–200)
HDL: 41.7 mg/dL (ref >39.00)
LDL CALC: 93 mg/dL (ref 0–99)
NonHDL: 112.3
Triglycerides: 98 mg/dL (ref 0.0–149.0)
VLDL: 19.6 mg/dL (ref 0.0–40.0)

## 2014-01-22 LAB — MICROALBUMIN / CREATININE URINE RATIO
Creatinine,U: 182.1 mg/dL
MICROALB UR: 0.7 mg/dL (ref 0.0–1.9)
Microalb Creat Ratio: 0.4 mg/g (ref 0.0–30.0)

## 2014-01-22 LAB — TSH: TSH: 0.9 u[IU]/mL (ref 0.35–4.50)

## 2014-01-22 LAB — FECAL OCCULT BLOOD, GUAIAC: Fecal Occult Blood: NEGATIVE

## 2014-01-22 LAB — HEMOGLOBIN A1C: Hgb A1c MFr Bld: 7.6 % — ABNORMAL HIGH (ref 4.6–6.5)

## 2014-01-22 NOTE — Assessment & Plan Note (Signed)
His blood sugars have been well controlled I will recheck his A1C and will monitor his renal function today 

## 2014-01-22 NOTE — Patient Instructions (Signed)
Type 2 Diabetes Mellitus Type 2 diabetes mellitus, often simply referred to as type 2 diabetes, is a long-lasting (chronic) disease. In type 2 diabetes, the pancreas does not make enough insulin (a hormone), the cells are less responsive to the insulin that is made (insulin resistance), or both. Normally, insulin moves sugars from food into the tissue cells. The tissue cells use the sugars for energy. The lack of insulin or the lack of normal response to insulin causes excess sugars to build up in the blood instead of going into the tissue cells. As a result, high blood sugar (hyperglycemia) develops. The effect of high sugar (glucose) levels can cause many complications. Type 2 diabetes was also previously called adult-onset diabetes, but it can occur at any age.  RISK FACTORS  A person is predisposed to developing type 2 diabetes if someone in the family has the disease and also has one or more of the following primary risk factors:  Overweight.  An inactive lifestyle.  A history of consistently eating high-calorie foods. Maintaining a normal weight and regular physical activity can reduce the chance of developing type 2 diabetes. SYMPTOMS  A person with type 2 diabetes may not show symptoms initially. The symptoms of type 2 diabetes appear slowly. The symptoms include:  Increased thirst (polydipsia).  Increased urination (polyuria).  Increased urination during the night (nocturia).  Weight loss. This weight loss may be rapid.  Frequent, recurring infections.  Tiredness (fatigue).  Weakness.  Vision changes, such as blurred vision.  Fruity smell to your breath.  Abdominal pain.  Nausea or vomiting.  Cuts or bruises which are slow to heal.  Tingling or numbness in the hands or feet. DIAGNOSIS Type 2 diabetes is frequently not diagnosed until complications of diabetes are present. Type 2 diabetes is diagnosed when symptoms or complications are present and when blood  glucose levels are increased. Your blood glucose level may be checked by one or more of the following blood tests:  A fasting blood glucose test. You will not be allowed to eat for at least 8 hours before a blood sample is taken.  A random blood glucose test. Your blood glucose is checked at any time of the day regardless of when you ate.  A hemoglobin A1c blood glucose test. A hemoglobin A1c test provides information about blood glucose control over the previous 3 months.  An oral glucose tolerance test (OGTT). Your blood glucose is measured after you have not eaten (fasted) for 2 hours and then after you drink a glucose-containing beverage. TREATMENT   You may need to take insulin or diabetes medicine daily to keep blood glucose levels in the desired range.  If you use insulin, you may need to adjust the dosage depending on the carbohydrates that you eat with each meal or snack. The treatment goal is to maintain the before meal blood sugar (preprandial glucose) level at 70-130 mg/dL. HOME CARE INSTRUCTIONS   Have your hemoglobin A1c level checked twice a year.  Perform daily blood glucose monitoring as directed by your health care provider.  Monitor urine ketones when you are ill and as directed by your health care provider.  Take your diabetes medicine or insulin as directed by your health care provider to maintain your blood glucose levels in the desired range.  Never run out of diabetes medicine or insulin. It is needed every day.  If you are using insulin, you may need to adjust the amount of insulin given based on your intake of   carbohydrates. Carbohydrates can raise blood glucose levels but need to be included in your diet. Carbohydrates provide vitamins, minerals, and fiber which are an essential part of a healthy diet. Carbohydrates are found in fruits, vegetables, whole grains, dairy products, legumes, and foods containing added sugars.  Eat healthy foods. You should make an  appointment to see a registered dietitian to help you create an eating plan that is right for you.  Lose weight if you are overweight.  Carry a medical alert card or wear your medical alert jewelry.  Carry a 15-gram carbohydrate snack with you at all times to treat low blood glucose (hypoglycemia). Some examples of 15-gram carbohydrate snacks include:  Glucose tablets, 3 or 4.  Glucose gel, 15-gram tube.  Raisins, 2 tablespoons (24 grams).  Jelly beans, 6.  Animal crackers, 8.  Regular pop, 4 ounces (120 mL).  Gummy treats, 9.  Recognize hypoglycemia. Hypoglycemia occurs with blood glucose levels of 70 mg/dL and below. The risk for hypoglycemia increases when fasting or skipping meals, during or after intense exercise, and during sleep. Hypoglycemia symptoms can include:  Tremors or shakes.  Decreased ability to concentrate.  Sweating.  Increased heart rate.  Headache.  Dry mouth.  Hunger.  Irritability.  Anxiety.  Restless sleep.  Altered speech or coordination.  Confusion.  Treat hypoglycemia promptly. If you are alert and able to safely swallow, follow the 15:15 rule:  Take 15-20 grams of rapid-acting glucose or carbohydrate. Rapid-acting options include glucose gel, glucose tablets, or 4 ounces (120 mL) of fruit juice, regular soda, or low-fat milk.  Check your blood glucose level 15 minutes after taking the glucose.  Take 15-20 grams more of glucose if the repeat blood glucose level is still 70 mg/dL or below.  Eat a meal or snack within 1 hour once blood glucose levels return to normal.  Be alert to feeling very thirsty and urinating more frequently than usual, which are early signs of hyperglycemia. An early awareness of hyperglycemia allows for prompt treatment. Treat hyperglycemia as directed by your health care provider.  Engage in at least 150 minutes of moderate-intensity physical activity a week, spread over at least 3 days of the week or as  directed by your health care provider. In addition, you should engage in resistance exercise at least 2 times a week or as directed by your health care provider. Try to spend no more than 90 minutes at one time inactive.  Adjust your medicine and food intake as needed if you start a new exercise or sport.  Follow your sick-day plan anytime you are unable to eat or drink as usual.  Do not use any tobacco products including cigarettes, chewing tobacco, or electronic cigarettes. If you need help quitting, ask your health care provider.  Limit alcohol intake to no more than 1 drink per day for nonpregnant women and 2 drinks per day for men. You should drink alcohol only when you are also eating food. Talk with your health care provider whether alcohol is safe for you. Tell your health care provider if you drink alcohol several times a week.  Keep all follow-up visits as directed by your health care provider. This is important.  Schedule an eye exam soon after the diagnosis of type 2 diabetes and then annually.  Perform daily skin and foot care. Examine your skin and feet daily for cuts, bruises, redness, nail problems, bleeding, blisters, or sores. A foot exam by a health care provider should be done annually.    Brush your teeth and gums at least twice a day and floss at least once a day. Follow up with your dentist regularly.  Share your diabetes management plan with your workplace or school.  Stay up-to-date with immunizations. It is recommended that people with diabetes who are over 65 years old get the pneumonia vaccine. In some cases, two separate shots may be given. Ask your health care provider if your pneumonia vaccination is up-to-date.  Learn to manage stress.  Obtain ongoing diabetes education and support as needed.  Participate in or seek rehabilitation as needed to maintain or improve independence and quality of life. Request a physical or occupational therapy referral if you are  having foot or hand numbness, or difficulties with grooming, dressing, eating, or physical activity. SEEK MEDICAL CARE IF:   You are unable to eat food or drink fluids for more than 6 hours.  You have nausea and vomiting for more than 6 hours.  Your blood glucose level is over 240 mg/dL.  There is a change in mental status.  You develop an additional serious illness.  You have diarrhea for more than 6 hours.  You have been sick or have had a fever for a couple of days and are not getting better.  You have pain during any physical activity.  SEEK IMMEDIATE MEDICAL CARE IF:  You have difficulty breathing.  You have moderate to large ketone levels. MAKE SURE YOU:  Understand these instructions.  Will watch your condition.  Will get help right away if you are not doing well or get worse. Document Released: 06/08/2005 Document Revised: 10/23/2013 Document Reviewed: 01/05/2012 Exit[employer] Patient Information 2015 [employer]. This information is not intended to replace advice given to you by your health care provider. Make sure you discuss any questions you have with your health care provider. Health Maintenance A healthy lifestyle and preventative care can promote health and wellness.  Maintain regular health, dental, and eye exams.  Eat a healthy diet. Foods like vegetables, fruits, whole grains, low-fat dairy products, and lean protein foods contain the nutrients you need and are low in calories. Decrease your intake of foods high in solid fats, added sugars, and salt. Get information about a proper diet from your health care provider, if necessary.  Regular physical exercise is one of the most important things you can do for your health. Most adults should get at least 150 minutes of moderate-intensity exercise (any activity that increases your heart rate and causes you to sweat) each week. In addition, most adults need muscle-strengthening exercises on 2 or more days a week.    Maintain a healthy weight. The body mass index (BMI) is a screening tool to identify possible weight problems. It provides an estimate of body fat based on height and weight. Your health care provider can find your BMI and can help you achieve or maintain a healthy weight. For males 20 years and older:  A BMI below 18.5 is considered underweight.  A BMI of 18.5 to 24.9 is normal.  A BMI of 25 to 29.9 is considered overweight.  A BMI of 30 and above is considered obese.  Maintain normal blood lipids and cholesterol by exercising and minimizing your intake of saturated fat. Eat a balanced diet with plenty of fruits and vegetables. Blood tests for lipids and cholesterol should begin at age 20 and be repeated every 5 years. If your lipid or cholesterol levels are high, you are over age 50, or you are at high   risk for heart disease, you may need your cholesterol levels checked more frequently.Ongoing high lipid and cholesterol levels should be treated with medicines if diet and exercise are not working.  If you smoke, find out from your health care provider how to quit. If you do not use tobacco, do not start.  Lung cancer screening is recommended for adults aged 55-80 years who are at high risk for developing lung cancer because of a history of smoking. A yearly low-dose CT scan of the lungs is recommended for people who have at least a 30-pack-year history of smoking and are current smokers or have quit within the past 15 years. A pack year of smoking is smoking an average of 1 pack of cigarettes a day for 1 year (for example, a 30-pack-year history of smoking could mean smoking 1 pack a day for 30 years or 2 packs a day for 15 years). Yearly screening should continue until the smoker has stopped smoking for at least 15 years. Yearly screening should be stopped for people who develop a health problem that would prevent them from having lung cancer treatment.  If you choose to drink alcohol, do not  have more than 2 drinks per day. One drink is considered to be 12 oz (360 mL) of beer, 5 oz (150 mL) of wine, or 1.5 oz (45 mL) of liquor.  Avoid the use of street drugs. Do not share needles with anyone. Ask for help if you need support or instructions about stopping the use of drugs.  High blood pressure causes heart disease and increases the risk of stroke. Blood pressure should be checked at least every 1-2 years. Ongoing high blood pressure should be treated with medicines if weight loss and exercise are not effective.  If you are 45-79 years old, ask your health care provider if you should take aspirin to prevent heart disease.  Diabetes screening involves taking a blood sample to check your fasting blood sugar level. This should be done once every 3 years after age 45 if you are at a normal weight and without risk factors for diabetes. Testing should be considered at a younger age or be carried out more frequently if you are overweight and have at least 1 risk factor for diabetes.  Colorectal cancer can be detected and often prevented. Most routine colorectal cancer screening begins at the age of 50 and continues through age 75. However, your health care provider may recommend screening at an earlier age if you have risk factors for colon cancer. On a yearly basis, your health care provider may provide home test kits to check for hidden blood in the stool. A small camera at the end of a tube may be used to directly examine the colon (sigmoidoscopy or colonoscopy) to detect the earliest forms of colorectal cancer. Talk to your health care provider about this at age 50 when routine screening begins. A direct exam of the colon should be repeated every 5-10 years through age 75, unless early forms of precancerous polyps or small growths are found.  People who are at an increased risk for hepatitis B should be screened for this virus. You are considered at high risk for hepatitis B if:  You were born  in a country where hepatitis B occurs often. Talk with your health care provider about which countries are considered high risk.  Your parents were born in a high-risk country and you have not received a shot to protect against hepatitis B (hepatitis B   vaccine).  You have HIV or AIDS.  You use needles to inject street drugs.  You live with, or have sex with, someone who has hepatitis B.  You are a man who has sex with other men (MSM).  You get hemodialysis treatment.  You take certain medicines for conditions like cancer, organ transplantation, and autoimmune conditions.  Hepatitis C blood testing is recommended for all people born from 1945 through 1965 and any individual with known risk factors for hepatitis C.  Healthy men should no longer receive prostate-specific antigen (PSA) blood tests as part of routine cancer screening. Talk to your health care provider about prostate cancer screening.  Testicular cancer screening is not recommended for adolescents or adult males who have no symptoms. Screening includes self-exam, a health care provider exam, and other screening tests. Consult with your health care provider about any symptoms you have or any concerns you have about testicular cancer.  Practice safe sex. Use condoms and avoid high-risk sexual practices to reduce the spread of sexually transmitted infections (STIs).  You should be screened for STIs, including gonorrhea and chlamydia if:  You are sexually active and are younger than 24 years.  You are older than 24 years, and your health care provider tells you that you are at risk for this type of infection.  Your sexual activity has changed since you were last screened, and you are at an increased risk for chlamydia or gonorrhea. Ask your health care provider if you are at risk.  If you are at risk of being infected with HIV, it is recommended that you take a prescription medicine daily to prevent HIV infection. This is  called pre-exposure prophylaxis (PrEP). You are considered at risk if:  You are a man who has sex with other men (MSM).  You are a heterosexual man who is sexually active with multiple partners.  You take drugs by injection.  You are sexually active with a partner who has HIV.  Talk with your health care provider about whether you are at high risk of being infected with HIV. If you choose to begin PrEP, you should first be tested for HIV. You should then be tested every 3 months for as long as you are taking PrEP.  Use sunscreen. Apply sunscreen liberally and repeatedly throughout the day. You should seek shade when your shadow is shorter than you. Protect yourself by wearing long sleeves, pants, a wide-brimmed hat, and sunglasses year round whenever you are outdoors.  Tell your health care provider of new moles or changes in moles, especially if there is a change in shape or color. Also, tell your health care provider if a mole is larger than the size of a pencil eraser.  A one-time screening for abdominal aortic aneurysm (AAA) and surgical repair of large AAAs by ultrasound is recommended for men aged 65-75 years who are current or former smokers.  Stay current with your vaccines (immunizations). Document Released: 12/05/2007 Document Revised: 06/13/2013 Document Reviewed: 11/03/2010 ExitCare Patient Information 2015 [employer]. This information is not intended to replace advice given to you by your health care provider. Make sure you discuss any questions you have with your health care provider.  

## 2014-01-22 NOTE — Progress Notes (Signed)
Subjective:    Patient ID: Randy Rodriguez, male    DOB: 12-08-1955, 58 y.o.   MRN: [id number]  Diabetes He presents for his follow-up diabetic visit. He has type 2 diabetes mellitus. His disease course has been stable. There are no hypoglycemic associated symptoms. Pertinent negatives for hypoglycemia include no dizziness. Pertinent negatives for diabetes include no blurred vision, no chest pain, no fatigue, no foot paresthesias, no foot ulcerations, no polydipsia, no polyphagia, no polyuria, no visual change, no weakness and no weight loss. There are no hypoglycemic complications. Symptoms are stable. There are no diabetic complications. Current diabetic treatment includes oral agent (monotherapy). He is compliant with treatment all of the time. His weight is stable. He is following a generally healthy diet. Meal planning includes avoidance of concentrated sweets. He has not had a previous visit with a dietician. He participates in exercise intermittently. There is no change in his home blood glucose trend. An ACE inhibitor/angiotensin II receptor blocker is contraindicated. He does not see a podiatrist.Eye exam is not current.      Review of Systems  Constitutional: Negative.  Negative for fever, chills, weight loss, diaphoresis, activity change, appetite change, fatigue and unexpected weight change.  HENT: Negative.   Eyes: Negative.  Negative for blurred vision.  Respiratory: Negative.  Negative for cough, choking, chest tightness, shortness of breath and stridor.   Cardiovascular: Negative.  Negative for chest pain, palpitations and leg swelling.  Gastrointestinal: Negative.  Negative for nausea, vomiting, abdominal pain, diarrhea, constipation and blood in stool.  Endocrine: Negative.  Negative for polydipsia, polyphagia and polyuria.  Genitourinary: Negative.   Musculoskeletal: Negative.  Negative for arthralgias, back pain, gait problem, joint swelling, myalgias, neck pain and neck  stiffness.  Skin: Negative.  Negative for rash.  Allergic/Immunologic: Negative.   Neurological: Negative.  Negative for dizziness, weakness, light-headedness and numbness.  Hematological: Negative.  Negative for adenopathy. Does not bruise/bleed easily.  Psychiatric/Behavioral: Negative.        Objective:   Physical Exam  Vitals reviewed. Constitutional: He is oriented to person, place, and time. He appears well-developed and well-nourished. No distress.  HENT:  Head: Normocephalic and atraumatic.  Mouth/Throat: Oropharynx is clear and moist. No oropharyngeal exudate.  Eyes: Conjunctivae are normal. Right eye exhibits no discharge. Left eye exhibits no discharge. No scleral icterus.  Neck: Normal range of motion. Neck supple. No JVD present. No tracheal deviation present. No thyromegaly present.  Cardiovascular: Normal rate, regular rhythm, normal heart sounds and intact distal pulses.  Exam reveals no gallop and no friction rub.   No murmur heard. Pulmonary/Chest: Effort normal and breath sounds normal. No stridor. No respiratory distress. He has no wheezes. He has no rales. He exhibits no tenderness.  Abdominal: Soft. Bowel sounds are normal. He exhibits no distension and no mass. There is no tenderness. There is no rebound and no guarding. Hernia confirmed negative in the right inguinal area and confirmed negative in the left inguinal area.  Genitourinary: Rectum normal, prostate normal, testes normal and penis normal. Rectal exam shows no external hemorrhoid, no internal hemorrhoid, no fissure, no mass, no tenderness and anal tone normal. Guaiac negative stool. Prostate is not enlarged and not tender. Right testis shows no mass, no swelling and no tenderness. Right testis is descended. Left testis shows no mass, no swelling and no tenderness. Left testis is descended. Circumcised. No penile erythema or penile tenderness. No discharge found.  Musculoskeletal: Normal range of motion. He  exhibits no edema and no  tenderness.  Lymphadenopathy:    He has no cervical adenopathy.       Right: No inguinal adenopathy present.       Left: No inguinal adenopathy present.  Neurological: He is oriented to person, place, and time.  Skin: Skin is warm and dry. No rash noted. He is not diaphoretic. No erythema. No pallor.  Psychiatric: He has a normal mood and affect. His behavior is normal. Judgment and thought content normal.    Lab Results  Component Value Date   WBC 7.5 03/14/2013   HGB 15.2 03/14/2013   HCT 44.6 03/14/2013   PLT 256.0 03/14/2013   GLUCOSE 126* 03/14/2013   CHOL 156 08/19/2012   TRIG 103.0 08/19/2012   HDL 41.60 08/19/2012   LDLCALC 94 08/19/2012   ALT 20 03/14/2013   AST 18 03/14/2013   NA 139 03/14/2013   K 4.6 03/14/2013   CL 101 03/14/2013   CREATININE 1.0 03/14/2013   BUN 16 03/14/2013   CO2 31 03/14/2013   TSH 0.69 03/14/2013   PSA 0.76 08/19/2012   INR 1.13 01/31/2013   HGBA1C 7.1* 02/03/2013   MICROALBUR 1.3 12/17/2011        Assessment & Plan:

## 2014-01-22 NOTE — Assessment & Plan Note (Signed)
Exam done Vaccines were reviewed Labs ordered Pt ed material was given 

## 2014-01-22 NOTE — Progress Notes (Signed)
Pre visit review using our clinic review tool, if applicable. No additional management support is needed unless otherwise documented below in the visit note. 

## 2014-01-22 NOTE — Assessment & Plan Note (Signed)
He is doing well on lipitor 

## 2014-04-09 ENCOUNTER — Other Ambulatory Visit: Payer: Self-pay | Admitting: Internal Medicine

## 2014-08-09 LAB — HM DIABETES EYE EXAM

## 2014-08-10 ENCOUNTER — Telehealth: Payer: Self-pay | Admitting: Internal Medicine

## 2014-08-10 DIAGNOSIS — E118 Type 2 diabetes mellitus with unspecified complications: Secondary | ICD-10-CM

## 2014-08-10 MED ORDER — METFORMIN HCL 1000 MG PO TABS: 1000.0000 mg | ORAL_TABLET | Freq: Two times a day (BID) | ORAL | Status: DC

## 2014-08-10 NOTE — Telephone Encounter (Signed)
Changed, please follow up in 2-3 months

## 2014-08-10 NOTE — Telephone Encounter (Signed)
Pt called in said that his ins will not cover  Medication   ONGLYZA 5 MG TABS tablet [99501]       ONGLYZA 5 MG TABS tablet [[id number]]      but they will cover Metformin.  He wants to see if Metformin can be called in instead.   **Also [facility] [employer]       Address: [address], [place],  [address]  [phone]  Hours:   Open today  10AM-8:30PM

## 2014-08-13 ENCOUNTER — Other Ambulatory Visit: Payer: Self-pay

## 2014-08-13 MED ORDER — ATORVASTATIN CALCIUM 40 MG PO TABS: 40.0000 mg | ORAL_TABLET | Freq: Every day | ORAL | Status: DC

## 2014-08-13 NOTE — Telephone Encounter (Signed)
Called pt spoke with wife pt pick up metformin on Sat...Randy Rodriguez

## 2014-08-15 ENCOUNTER — Other Ambulatory Visit (INDEPENDENT_AMBULATORY_CARE_PROVIDER_SITE_OTHER): Payer: 59

## 2014-08-15 ENCOUNTER — Encounter: Payer: Self-pay | Admitting: Internal Medicine

## 2014-08-15 ENCOUNTER — Ambulatory Visit (INDEPENDENT_AMBULATORY_CARE_PROVIDER_SITE_OTHER): Payer: 59 | Admitting: Internal Medicine

## 2014-08-15 VITALS — BP 120/80 | HR 72 | Temp 97.7°F | Ht 74.0 in | Wt 251.5 lb

## 2014-08-15 DIAGNOSIS — E118 Type 2 diabetes mellitus with unspecified complications: Secondary | ICD-10-CM

## 2014-08-15 DIAGNOSIS — Z23 Encounter for immunization: Secondary | ICD-10-CM

## 2014-08-15 LAB — BASIC METABOLIC PANEL
BUN: 18 mg/dL (ref 6–23)
CALCIUM: 9.9 mg/dL (ref 8.4–10.5)
CO2: 27 meq/L (ref 19–32)
CREATININE: 0.94 mg/dL (ref 0.40–1.50)
Chloride: 104 mEq/L (ref 96–112)
GFR: 87.3 mL/min (ref >60.00)
Glucose, Bld: 165 mg/dL — ABNORMAL HIGH (ref 70–99)
POTASSIUM: 4.1 meq/L (ref 3.5–5.1)
SODIUM: 137 meq/L (ref 135–145)

## 2014-08-15 LAB — HEMOGLOBIN A1C: Hgb A1c MFr Bld: 8.1 % — ABNORMAL HIGH (ref 4.6–6.5)

## 2014-08-15 MED ORDER — ONETOUCH VERIO IQ SYSTEM W/DEVICE KIT: 1.0000 | PACK | Freq: Two times a day (BID) | Status: DC

## 2014-08-15 MED ORDER — GLUCOSE BLOOD VI STRP: ORAL_STRIP | Status: DC

## 2014-08-15 MED ORDER — EXENATIDE ER 2 MG ~~LOC~~ PEN: 1.0000 | PEN_INJECTOR | SUBCUTANEOUS | Status: DC

## 2014-08-15 NOTE — Progress Notes (Signed)
Subjective:    Patient ID: Randy Rodriguez, male    DOB: 1956/02/04, 59 y.o.   MRN: [id number]  Diabetes He presents for his follow-up diabetic visit. He has type 2 diabetes mellitus. His disease course has been stable. Pertinent negatives for hypoglycemia include no dizziness or tremors. Pertinent negatives for diabetes include no blurred vision, no chest pain, no fatigue, no foot paresthesias, no foot ulcerations, no polydipsia, no polyphagia, no polyuria, no visual change, no weakness and no weight loss. There are no hypoglycemic complications. Symptoms are stable. There are no diabetic complications. His weight is stable. He is following a generally healthy diet. Meal planning includes avoidance of concentrated sweets. He participates in exercise intermittently. There is no change in his home blood glucose trend. An ACE inhibitor/angiotensin II receptor blocker is not being taken. He does not see a podiatrist.Eye exam is current.      Review of Systems  Constitutional: Negative.  Negative for weight loss and fatigue.  HENT: Negative.   Eyes: Negative.  Negative for blurred vision.  Respiratory: Negative.  Negative for cough, choking, chest tightness, shortness of breath and stridor.   Cardiovascular: Negative.  Negative for chest pain, palpitations and leg swelling.  Gastrointestinal: Negative.  Negative for nausea, vomiting, abdominal pain, diarrhea, constipation and blood in stool.  Endocrine: Negative.  Negative for polydipsia, polyphagia and polyuria.  Genitourinary: Negative.   Musculoskeletal: Negative.  Negative for myalgias, back pain, arthralgias and neck pain.  Skin: Negative.   Allergic/Immunologic: Negative.   Neurological: Negative.  Negative for dizziness, tremors, syncope, weakness, light-headedness and numbness.  Hematological: Negative.  Negative for adenopathy. Does not bruise/bleed easily.  Psychiatric/Behavioral: Negative.        Objective:   Physical Exam    Constitutional: He is oriented to person, place, and time. He appears well-developed and well-nourished. No distress.  HENT:  Head: Normocephalic and atraumatic.  Mouth/Throat: Oropharynx is clear and moist. No oropharyngeal exudate.  Eyes: Conjunctivae are normal. Right eye exhibits no discharge. Left eye exhibits no discharge. No scleral icterus.  Neck: Normal range of motion. Neck supple. No JVD present. No tracheal deviation present. No thyromegaly present.  Cardiovascular: Normal rate, regular rhythm, normal heart sounds and intact distal pulses.  Exam reveals no gallop and no friction rub.   No murmur heard. Pulmonary/Chest: Effort normal and breath sounds normal. No stridor. No respiratory distress. He has no wheezes. He has no rales. He exhibits no tenderness.  Abdominal: Soft. Bowel sounds are normal. He exhibits no distension and no mass. There is no tenderness. There is no rebound and no guarding.  Musculoskeletal: Normal range of motion. He exhibits no edema or tenderness.  Lymphadenopathy:    He has no cervical adenopathy.  Neurological: He is oriented to person, place, and time.  Skin: Skin is warm and dry. No rash noted. He is not diaphoretic. No erythema. No pallor.  Vitals reviewed.    Lab Results  Component Value Date   WBC 6.2 01/22/2014   HGB 15.7 01/22/2014   HCT 46.6 01/22/2014   PLT 205.0 01/22/2014   GLUCOSE 140* 01/22/2014   CHOL 154 01/22/2014   TRIG 98.0 01/22/2014   HDL 41.70 01/22/2014   LDLCALC 93 01/22/2014   ALT 25 01/22/2014   AST 23 01/22/2014   NA 135 01/22/2014   K 4.7 01/22/2014   CL 100 01/22/2014   CREATININE 0.9 01/22/2014   BUN 16 01/22/2014   CO2 29 01/22/2014   TSH 0.90 01/22/2014  PSA 0.84 01/22/2014   INR 1.13 01/31/2013   HGBA1C 7.6* 01/22/2014   MICROALBUR 0.7 01/22/2014       Assessment & Plan:

## 2014-08-15 NOTE — Patient Instructions (Signed)

## 2014-08-15 NOTE — Assessment & Plan Note (Signed)
His blood sugars are not well controlled Will cont metformin I have asked him to start bydureon as well

## 2014-08-15 NOTE — Progress Notes (Signed)
Pre visit review using our clinic review tool, if applicable. No additional management support is needed unless otherwise documented below in the visit note. 

## 2014-09-03 ENCOUNTER — Telehealth: Payer: Self-pay | Admitting: Internal Medicine

## 2014-09-03 DIAGNOSIS — E118 Type 2 diabetes mellitus with unspecified complications: Secondary | ICD-10-CM

## 2014-09-03 MED ORDER — EXENATIDE ER 2 MG ~~LOC~~ PEN: 1.0000 | PEN_INJECTOR | SUBCUTANEOUS | Status: DC

## 2014-09-03 NOTE — Telephone Encounter (Signed)
Please resend Exenatide ER (BYDUREON) 2 MG PEN to [facility], pt is no longer use [employer] [facility]

## 2014-09-03 NOTE — Telephone Encounter (Signed)
Pharmacy updated and rx approved.

## 2014-10-16 ENCOUNTER — Other Ambulatory Visit: Payer: Self-pay | Admitting: Internal Medicine

## 2014-12-11 ENCOUNTER — Encounter: Payer: Self-pay | Admitting: Internal Medicine

## 2014-12-11 ENCOUNTER — Other Ambulatory Visit (INDEPENDENT_AMBULATORY_CARE_PROVIDER_SITE_OTHER): Payer: 59

## 2014-12-11 DIAGNOSIS — IMO0002 Reserved for concepts with insufficient information to code with codable children: Secondary | ICD-10-CM

## 2014-12-11 DIAGNOSIS — E1165 Type 2 diabetes mellitus with hyperglycemia: Secondary | ICD-10-CM | POA: Diagnosis not present

## 2014-12-11 LAB — BASIC METABOLIC PANEL
BUN: 19 mg/dL (ref 6–23)
CO2: 27 mEq/L (ref 19–32)
Calcium: 9.7 mg/dL (ref 8.4–10.5)
Chloride: 103 mEq/L (ref 96–112)
Creatinine, Ser: 0.99 mg/dL (ref 0.40–1.50)
GFR: 82.14 mL/min (ref >60.00)
Glucose, Bld: 92 mg/dL (ref 70–99)
Potassium: 4.5 mEq/L (ref 3.5–5.1)
Sodium: 138 mEq/L (ref 135–145)

## 2014-12-11 LAB — HEMOGLOBIN A1C: HEMOGLOBIN A1C: 5.9 % (ref 4.6–6.5)

## 2014-12-14 ENCOUNTER — Ambulatory Visit: Payer: 59 | Admitting: Internal Medicine

## 2014-12-17 ENCOUNTER — Ambulatory Visit (INDEPENDENT_AMBULATORY_CARE_PROVIDER_SITE_OTHER): Payer: 59 | Admitting: Internal Medicine

## 2014-12-17 ENCOUNTER — Encounter: Payer: Self-pay | Admitting: Internal Medicine

## 2014-12-17 VITALS — BP 118/90 | HR 72 | Temp 97.7°F | Resp 16 | Ht 74.0 in | Wt 219.1 lb

## 2014-12-17 DIAGNOSIS — E118 Type 2 diabetes mellitus with unspecified complications: Secondary | ICD-10-CM | POA: Diagnosis not present

## 2014-12-17 NOTE — Patient Instructions (Signed)

## 2014-12-17 NOTE — Progress Notes (Signed)
Subjective:  Patient ID: Randy Rodriguez, male    DOB: 1956/05/08  Age: 59 y.o. MRN: [id number]  CC: Diabetes   HPI Randy Rodriguez presents for a follow up on DM2 - he does not want to use bydureon anymore because it makes his stomach feel "sick" though he is happy that he has lost weight and his A1C is way down. He offers no other complaints.  Outpatient Prescriptions Prior to Visit  Medication Sig Dispense Refill  . atorvastatin (LIPITOR) 40 MG tablet Take 1 tablet by mouth  daily 90 tablet 3  . Blood Glucose Monitoring Suppl (ONETOUCH VERIO IQ SYSTEM) W/DEVICE KIT 1 Act by Does not apply route 2 (two) times daily. 1 kit 0  . glucose blood test strip Use BID 100 each 11  . metFORMIN (GLUCOPHAGE) 1000 MG tablet Take 1 tablet (1,000 mg total) by mouth 2 (two) times daily with a meal. 180 tablet 0  . omeprazole (PRILOSEC) 20 MG capsule Take 20 mg by mouth daily.      . Exenatide ER (BYDUREON) 2 MG PEN Inject 1 Act into the skin once a week. 4 each 11  . tacrolimus (PROTOPIC) 0.1 % ointment      No facility-administered medications prior to visit.    ROS Review of Systems  Constitutional: Positive for unexpected weight change. Negative for fever, chills, diaphoresis, appetite change and fatigue.  HENT: Negative.   Eyes: Negative.   Respiratory: Negative.  Negative for cough, choking, shortness of breath and stridor.   Cardiovascular: Negative.  Negative for chest pain, palpitations and leg swelling.  Gastrointestinal: Negative.  Negative for nausea, vomiting, abdominal pain, diarrhea, constipation and blood in stool.  Endocrine: Negative.  Negative for polydipsia, polyphagia and polyuria.  Genitourinary: Negative.  Negative for dysuria, urgency, enuresis and difficulty urinating.  Musculoskeletal: Negative.   Skin: Negative.   Allergic/Immunologic: Negative.   Neurological: Negative.  Negative for dizziness, tremors, syncope, light-headedness, numbness and headaches.  Hematological:  Negative.  Negative for adenopathy. Does not bruise/bleed easily.  Psychiatric/Behavioral: Negative.     Objective:  BP 118/90 mmHg  Pulse 72  Temp(Src) 97.7 F (36.5 C) (Oral)  Ht 6' 2"  (1.88 m)  Wt 219 lb 1.9 oz (99.392 kg)  BMI 28.12 kg/m2  SpO2 98%  BP Readings from Last 3 Encounters:  12/17/14 118/90  08/15/14 120/80  01/22/14 118/78    Wt Readings from Last 3 Encounters:  12/17/14 219 lb 1.9 oz (99.392 kg)  08/15/14 251 lb 8 oz (114.08 kg)  01/22/14 244 lb (110.678 kg)    Physical Exam  Constitutional: He is oriented to person, place, and time. He appears well-developed and well-nourished. No distress.  HENT:  Head: Normocephalic and atraumatic.  Mouth/Throat: Oropharynx is clear and moist. No oropharyngeal exudate.  Eyes: Conjunctivae are normal. Right eye exhibits no discharge. Left eye exhibits no discharge. No scleral icterus.  Neck: Normal range of motion. Neck supple. No JVD present. No tracheal deviation present. No thyromegaly present.  Cardiovascular: Normal rate, regular rhythm, normal heart sounds and intact distal pulses.  Exam reveals no gallop and no friction rub.   No murmur heard. Pulmonary/Chest: Effort normal and breath sounds normal. No stridor. No respiratory distress. He has no wheezes. He has no rales. He exhibits no tenderness.  Abdominal: Soft. Bowel sounds are normal. He exhibits no distension and no mass. There is no tenderness. There is no rebound and no guarding.  Musculoskeletal: Normal range of motion. He exhibits no edema or tenderness.  Lymphadenopathy:    He has no cervical adenopathy.  Neurological: He is oriented to person, place, and time.  Skin: Skin is warm and dry. No rash noted. He is not diaphoretic. No erythema. No pallor.  Vitals reviewed.   Lab Results  Component Value Date   WBC 6.2 01/22/2014   HGB 15.7 01/22/2014   HCT 46.6 01/22/2014   PLT 205.0 01/22/2014   GLUCOSE 92 12/11/2014   CHOL 154 01/22/2014   TRIG  98.0 01/22/2014   HDL 41.70 01/22/2014   LDLCALC 93 01/22/2014   ALT 25 01/22/2014   AST 23 01/22/2014   NA 138 12/11/2014   K 4.5 12/11/2014   CL 103 12/11/2014   CREATININE 0.99 12/11/2014   BUN 19 12/11/2014   CO2 27 12/11/2014   TSH 0.90 01/22/2014   PSA 0.84 01/22/2014   INR 1.13 01/31/2013   HGBA1C 5.9 12/11/2014   MICROALBUR 0.7 01/22/2014    Ct Abdomen Pelvis W Contrast  01/31/2013   *RADIOLOGY REPORT*  Clinical Data: Hematuria  CT ABDOMEN AND PELVIS WITH CONTRAST  Technique:  Multidetector CT imaging of the abdomen and pelvis was performed following the standard protocol during bolus administration of intravenous contrast.  Contrast: 9m OMNIPAQUE IOHEXOL 300 MG/ML  SOLN, 1037mOMNIPAQUE IOHEXOL 300 MG/ML  SOLN  Comparison: None.  Findings: Curvilinear bibasilar dependent atelectasis noted.  Too small to characterize 6 mm right mid renal cortical hypodensity image 33.  3.3 cm left lower renal pole cortical cyst.  No hydronephrosis.  4 mm left lower renal pole too small to characterize hypodensity image 44.  No radiopaque renal or ureteral calculus.  Scattered colonic diverticuli noted without evidence for diverticulitis.  Bladder is normal.  The appendix is not identified but there is no secondary evidence for acute appendicitis.  No bowel wall thickening or focal segmental dilatation.  Liver, gallbladder, spleen, pancreas, and adrenal glands are normal.  No free air or fluid.  No lymphadenopathy.  Pleural-based calcification or right hepatic lobe granuloma image 25.  No acute osseous finding.  IMPRESSION: No acute intra-abdominal or pelvic pathology.   Original Report Authenticated By: GrConchita ParisM.D.    Assessment & Plan:   JaLavonteas seen today for diabetes.  Diagnoses and all orders for this visit:  Type II diabetes mellitus with manifestations - for several reasons I will discontinue the bydureon, the most important is that his A1C is 5.9% and he does not appear to need  this anymore, will cont the metformin  I have discontinued Mr. Randy Rodriguez and Exenatide ER. I am also having him maintain his omeprazole, metFORMIN, ONETOUCH VERIO IQ SYSTEM, glucose blood, atorvastatin, and hydrocortisone.  Meds ordered this encounter  Medications  . hydrocortisone 2.5 % cream    Sig: APPLY TWICE DAILY TO AFFECTED AREAS ON FACE AS NEEDED FOR RASH    Refill:  2     Follow-up: Return in about 4 months (around 04/18/2015).  ThScarlette CalicoMD

## 2015-01-07 ENCOUNTER — Other Ambulatory Visit: Payer: Self-pay | Admitting: Internal Medicine

## 2015-02-07 ENCOUNTER — Other Ambulatory Visit: Payer: Self-pay

## 2015-02-07 MED ORDER — METFORMIN HCL 1000 MG PO TABS: 1000.0000 mg | ORAL_TABLET | Freq: Two times a day (BID) | ORAL | Status: DC

## 2015-04-15 ENCOUNTER — Other Ambulatory Visit (INDEPENDENT_AMBULATORY_CARE_PROVIDER_SITE_OTHER): Payer: 59

## 2015-04-15 ENCOUNTER — Telehealth: Payer: Self-pay

## 2015-04-15 DIAGNOSIS — Z0000 Encounter for general adult medical examination without abnormal findings: Secondary | ICD-10-CM

## 2015-04-15 LAB — CBC WITH DIFFERENTIAL/PLATELET
BASOS PCT: 0.4 % (ref 0.0–3.0)
Basophils Absolute: 0 10*3/uL (ref 0.0–0.1)
EOS PCT: 5.6 % — AB (ref 0.0–5.0)
Eosinophils Absolute: 0.4 10*3/uL (ref 0.0–0.7)
HEMATOCRIT: 47.5 % (ref 39.0–52.0)
HEMOGLOBIN: 15.8 g/dL (ref 13.0–17.0)
Lymphocytes Relative: 31 % (ref 12.0–46.0)
Lymphs Abs: 2 10*3/uL (ref 0.7–4.0)
MCHC: 33.2 g/dL (ref 30.0–36.0)
MCV: 88.7 fl (ref 78.0–100.0)
Monocytes Absolute: 0.7 10*3/uL (ref 0.1–1.0)
Monocytes Relative: 10.5 % (ref 3.0–12.0)
Neutro Abs: 3.4 10*3/uL (ref 1.4–7.7)
Neutrophils Relative %: 52.5 % (ref 43.0–77.0)
Platelets: 221 10*3/uL (ref 150.0–400.0)
RBC: 5.35 Mil/uL (ref 4.22–5.81)
RDW: 13.8 % (ref 11.5–15.5)
WBC: 6.5 10*3/uL (ref 4.0–10.5)

## 2015-04-15 LAB — LIPID PANEL
CHOL/HDL RATIO: 4
CHOLESTEROL: 139 mg/dL (ref 0–200)
HDL: 39.6 mg/dL (ref >39.00)
LDL Cholesterol: 83 mg/dL (ref 0–99)
NonHDL: 99.54
TRIGLYCERIDES: 84 mg/dL (ref 0.0–149.0)
VLDL: 16.8 mg/dL (ref 0.0–40.0)

## 2015-04-15 LAB — BASIC METABOLIC PANEL
BUN: 20 mg/dL (ref 6–23)
CHLORIDE: 101 meq/L (ref 96–112)
CO2: 30 meq/L (ref 19–32)
Calcium: 10.6 mg/dL — ABNORMAL HIGH (ref 8.4–10.5)
Creatinine, Ser: 0.85 mg/dL (ref 0.40–1.50)
GFR: 97.83 mL/min (ref >60.00)
Glucose, Bld: 74 mg/dL (ref 70–99)
Potassium: 4.8 mEq/L (ref 3.5–5.1)
SODIUM: 140 meq/L (ref 135–145)

## 2015-04-15 LAB — HEPATIC FUNCTION PANEL
ALT: 14 U/L (ref 0–53)
AST: 14 U/L (ref 0–37)
Albumin: 4.3 g/dL (ref 3.5–5.2)
Alkaline Phosphatase: 54 U/L (ref 39–117)
Bilirubin, Direct: 0.1 mg/dL (ref 0.0–0.3)
TOTAL PROTEIN: 7.5 g/dL (ref 6.0–8.3)
Total Bilirubin: 0.6 mg/dL (ref 0.2–1.2)

## 2015-04-15 LAB — HEMOGLOBIN A1C: Hgb A1c MFr Bld: 5.9 % (ref 4.6–6.5)

## 2015-04-15 LAB — TSH: TSH: 1.02 u[IU]/mL (ref 0.35–4.50)

## 2015-04-15 NOTE — Telephone Encounter (Signed)
Pt is requesting labs drawn prior to visit on Wednesday.

## 2015-04-16 ENCOUNTER — Encounter: Payer: Self-pay | Admitting: Internal Medicine

## 2015-04-16 LAB — PSA, TOTAL AND FREE
PSA FREE: 0.22 ng/mL
PSA, Free Pct: 15 % — ABNORMAL LOW (ref >25)
PSA: 1.5 ng/mL (ref <=4.00)

## 2015-04-18 ENCOUNTER — Ambulatory Visit (INDEPENDENT_AMBULATORY_CARE_PROVIDER_SITE_OTHER): Payer: 59 | Admitting: Internal Medicine

## 2015-04-18 ENCOUNTER — Encounter: Payer: Self-pay | Admitting: Internal Medicine

## 2015-04-18 ENCOUNTER — Other Ambulatory Visit: Payer: 59

## 2015-04-18 DIAGNOSIS — E118 Type 2 diabetes mellitus with unspecified complications: Secondary | ICD-10-CM

## 2015-04-18 DIAGNOSIS — Z23 Encounter for immunization: Secondary | ICD-10-CM

## 2015-04-18 NOTE — Patient Instructions (Signed)

## 2015-04-18 NOTE — Progress Notes (Signed)
Pre visit review using our clinic review tool, if applicable. No additional management support is needed unless otherwise documented below in the visit note. 

## 2015-04-18 NOTE — Progress Notes (Signed)
Subjective:  Patient ID: Randy Rodriguez, male    DOB: 09/03/1955  Age: 59 y.o. MRN: [id number]  CC: Diabetes   HPI Randy Rodriguez presents for follow-up on diabetes and recent lab work. His A1c was down to 5.9%. His calcium level was slightly elevated. He has been doing well with his lifestyle modifications including diet, exercise, weight loss. He feels well today and offers no complaints.  Outpatient Prescriptions Prior to Visit  Medication Sig Dispense Refill  . atorvastatin (LIPITOR) 40 MG tablet Take 1 tablet by mouth  daily 90 tablet 3  . Blood Glucose Monitoring Suppl (ONETOUCH VERIO IQ SYSTEM) W/DEVICE KIT 1 Act by Does not apply route 2 (two) times daily. 1 kit 0  . glucose blood test strip Use BID 100 each 11  . hydrocortisone 2.5 % cream APPLY TWICE DAILY TO AFFECTED AREAS ON FACE AS NEEDED FOR RASH  2  . omeprazole (PRILOSEC) 20 MG capsule Take 20 mg by mouth daily.      . metFORMIN (GLUCOPHAGE) 1000 MG tablet Take 1 tablet (1,000 mg total) by mouth 2 (two) times daily with a meal. 180 tablet 1   No facility-administered medications prior to visit.    ROS Review of Systems  Constitutional: Negative.   HENT: Negative.   Eyes: Negative.   Respiratory: Negative.  Negative for cough, choking, chest tightness, shortness of breath and stridor.   Cardiovascular: Negative.  Negative for chest pain, palpitations and leg swelling.  Gastrointestinal: Negative.  Negative for nausea, vomiting, abdominal pain, diarrhea and constipation.  Endocrine: Negative.  Negative for polydipsia, polyphagia and polyuria.  Genitourinary: Negative.  Negative for difficulty urinating.  Musculoskeletal: Negative.   Skin: Negative.   Allergic/Immunologic: Negative.   Neurological: Negative.   Hematological: Negative.  Negative for adenopathy.  Psychiatric/Behavioral: Negative.     Objective:  BP 128/88 mmHg  Pulse 66  Temp(Src) 97.7 F (36.5 C) (Oral)  Ht _0  (1.88 m)  Wt 218 lb (98.884 kg)   BMI 27.98 kg/m2  SpO2 97%  BP Readings from Last 3 Encounters:  04/18/15 128/88  12/17/14 118/90  08/15/14 120/80    Wt Readings from Last 3 Encounters:  04/18/15 218 lb (98.884 kg)  12/17/14 219 lb 1.9 oz (99.392 kg)  08/15/14 251 lb 8 oz (114.08 kg)    Physical Exam  Constitutional: He is oriented to person, place, and time. He appears well-developed and well-nourished. No distress.  HENT:  Mouth/Throat: Oropharynx is clear and moist. No oropharyngeal exudate.  Eyes: Conjunctivae are normal. Right eye exhibits no discharge. Left eye exhibits no discharge. No scleral icterus.  Neck: Normal range of motion. Neck supple. No JVD present. No tracheal deviation present. No thyromegaly present.  Cardiovascular: Normal rate, regular rhythm, normal heart sounds and intact distal pulses.  Exam reveals no gallop and no friction rub.   No murmur heard. Pulmonary/Chest: Effort normal and breath sounds normal. No stridor. No respiratory distress. He has no wheezes. He has no rales. He exhibits no tenderness.  Abdominal: Soft. Bowel sounds are normal. He exhibits no distension and no mass. There is no tenderness. There is no rebound and no guarding.  Musculoskeletal: Normal range of motion. He exhibits no edema or tenderness.  Lymphadenopathy:    He has no cervical adenopathy.  Neurological: He is oriented to person, place, and time.  Skin: Skin is warm and dry. No rash noted. He is not diaphoretic. No erythema. No pallor.  Vitals reviewed.   Lab Results  Component  Value Date   WBC 6.5 04/15/2015   HGB 15.8 04/15/2015   HCT 47.5 04/15/2015   PLT 221.0 04/15/2015   GLUCOSE 74 04/15/2015   CHOL 139 04/15/2015   TRIG 84.0 04/15/2015   HDL 39.60 04/15/2015   LDLCALC 83 04/15/2015   ALT 14 04/15/2015   AST 14 04/15/2015   NA 140 04/15/2015   K 4.8 04/15/2015   CL 101 04/15/2015   CREATININE 0.85 04/15/2015   BUN 20 04/15/2015   CO2 30 04/15/2015   TSH 1.02 04/15/2015   PSA 1.50  04/15/2015   INR 1.13 01/31/2013   HGBA1C 5.9 04/15/2015   MICROALBUR 0.7 01/22/2014    Ct Abdomen Pelvis W Contrast  01/31/2013  *RADIOLOGY REPORT* Clinical Data: Hematuria CT ABDOMEN AND PELVIS WITH CONTRAST Technique:  Multidetector CT imaging of the abdomen and pelvis was performed following the standard protocol during bolus administration of intravenous contrast. Contrast: 26m OMNIPAQUE IOHEXOL 300 MG/ML  SOLN, 1072mOMNIPAQUE IOHEXOL 300 MG/ML  SOLN Comparison: None. Findings: Curvilinear bibasilar dependent atelectasis noted. Too small to characterize 6 mm right mid renal cortical hypodensity image 33.  3.3 cm left lower renal pole cortical cyst.  No hydronephrosis.  4 mm left lower renal pole too small to characterize hypodensity image 44.  No radiopaque renal or ureteral calculus. Scattered colonic diverticuli noted without evidence for diverticulitis.  Bladder is normal.  The appendix is not identified but there is no secondary evidence for acute appendicitis.  No bowel wall thickening or focal segmental dilatation.  Liver, gallbladder, spleen, pancreas, and adrenal glands are normal. No free air or fluid.  No lymphadenopathy.  Pleural-based calcification or right hepatic lobe granuloma image 25.  No acute osseous finding. IMPRESSION: No acute intra-abdominal or pelvic pathology. Original Report Authenticated By: GrConchita ParisM.D.    Assessment & Plan:   JaJuanantonioas seen today for diabetes.  Diagnoses and all orders for this visit:  Hypercalcemia- his calcium level was very slightly elevated and his albumin level was normal. I think this is most likely a lab error but today I will repeat it and check his parathyroid hormone level. -     PTH, intact and calcium; Future  Need for influenza vaccination -     Flu Vaccine QUAD 36+ mos IM  Type 2 diabetes mellitus with complication, without long-term current use of insulin ([place] his A1c is down to 5.9% so I have advised him to  discontinue taking metformin. He will continue to work on his lifestyle modifications and I will recheck his A1c in about 4-6 months. He will let me sooner if he develops any new symptoms.   I have discontinued Mr. ReFluegeletFORMIN. I am also having him maintain his omeprazole, ONETOUCH VERIO IQ SYSTEM, glucose blood, atorvastatin, and hydrocortisone.  No orders of the defined types were placed in this encounter.     Follow-up: Return in about 6 months (around 10/17/2015).  ThScarlette CalicoMD

## 2015-04-19 LAB — PTH, INTACT AND CALCIUM
Calcium: 9.8 mg/dL (ref 8.4–10.5)
PTH: 43 pg/mL (ref 14–64)

## 2015-04-20 ENCOUNTER — Encounter: Payer: Self-pay | Admitting: Internal Medicine

## 2015-06-19 ENCOUNTER — Other Ambulatory Visit: Payer: Self-pay | Admitting: Internal Medicine

## 2015-10-11 ENCOUNTER — Other Ambulatory Visit: Payer: Self-pay | Admitting: Internal Medicine

## 2015-10-17 ENCOUNTER — Ambulatory Visit: Payer: 59 | Admitting: Internal Medicine

## 2015-10-22 ENCOUNTER — Other Ambulatory Visit: Payer: Self-pay | Admitting: Geriatric Medicine

## 2015-10-22 ENCOUNTER — Other Ambulatory Visit (INDEPENDENT_AMBULATORY_CARE_PROVIDER_SITE_OTHER): Payer: 59

## 2015-10-22 DIAGNOSIS — E118 Type 2 diabetes mellitus with unspecified complications: Secondary | ICD-10-CM | POA: Diagnosis not present

## 2015-10-22 LAB — HEMOGLOBIN A1C: Hgb A1c MFr Bld: 6.4 % (ref 4.6–6.5)

## 2015-10-24 ENCOUNTER — Ambulatory Visit (INDEPENDENT_AMBULATORY_CARE_PROVIDER_SITE_OTHER): Payer: 59 | Admitting: Internal Medicine

## 2015-10-24 ENCOUNTER — Encounter: Payer: Self-pay | Admitting: Internal Medicine

## 2015-10-24 VITALS — BP 106/80 | HR 65 | Temp 97.5°F | Resp 16 | Ht 74.0 in | Wt 222.0 lb

## 2015-10-24 DIAGNOSIS — E118 Type 2 diabetes mellitus with unspecified complications: Secondary | ICD-10-CM

## 2015-10-24 DIAGNOSIS — E785 Hyperlipidemia, unspecified: Secondary | ICD-10-CM

## 2015-10-24 NOTE — Patient Instructions (Signed)

## 2015-10-24 NOTE — Progress Notes (Signed)
Pre visit review using our clinic review tool, if applicable. No additional management support is needed unless otherwise documented below in the visit note. 

## 2015-10-24 NOTE — Progress Notes (Signed)
Subjective:  Patient ID: Randy Rodriguez, male    DOB: 03/29/1956  Age: 60 y.o. MRN: [id number]  CC: Diabetes   HPI Paulo Keimig presents for follow-up on diabetes. He is not currently taking any medications. He had previously taken metformin but his A1c was down to 5.9% so metformin was discontinued. He has been working diligently on his lifestyle modifications. His most recent A1c was 6.4%. He feels well today and offers no complaints.  Outpatient Prescriptions Prior to Visit  Medication Sig Dispense Refill  . atorvastatin (LIPITOR) 40 MG tablet Take 1 tablet by mouth  daily 90 tablet 0  . Blood Glucose Monitoring Suppl (ONETOUCH VERIO IQ SYSTEM) W/DEVICE KIT 1 Act by Does not apply route 2 (two) times daily. 1 kit 0  . glucose blood test strip Use BID 100 each 11  . hydrocortisone 2.5 % cream APPLY TWICE DAILY TO AFFECTED AREAS ON FACE AS NEEDED FOR RASH  2  . omeprazole (PRILOSEC) 20 MG capsule Take 20 mg by mouth daily.      . metFORMIN (GLUCOPHAGE) 1000 MG tablet Take 1 tablet by mouth two  times daily with meals 180 tablet 1   No facility-administered medications prior to visit.    ROS Review of Systems  Constitutional: Negative.  Negative for fever, chills, diaphoresis, appetite change and fatigue.  HENT: Negative.   Eyes: Negative.  Negative for visual disturbance.  Respiratory: Negative.  Negative for cough, choking, chest tightness, shortness of breath and stridor.   Cardiovascular: Negative.  Negative for chest pain, palpitations and leg swelling.  Gastrointestinal: Negative.  Negative for nausea, vomiting, abdominal pain, diarrhea and constipation.  Endocrine: Negative.  Negative for polydipsia, polyphagia and polyuria.  Genitourinary: Negative.  Negative for dysuria, urgency and hematuria.  Musculoskeletal: Negative.  Negative for myalgias, arthralgias and neck pain.  Skin: Negative.   Allergic/Immunologic: Negative.   Neurological: Negative.  Negative for dizziness and  weakness.  Hematological: Negative.  Negative for adenopathy. Does not bruise/bleed easily.  Psychiatric/Behavioral: Negative.     Objective:  BP 106/80 mmHg  Pulse 65  Temp(Src) 97.5 F (36.4 C) (Oral)  Resp 16  Ht 6' 2"  (1.88 m)  Wt 222 lb (100.699 kg)  BMI 28.49 kg/m2  SpO2 94%  BP Readings from Last 3 Encounters:  10/24/15 106/80  04/18/15 128/88  12/17/14 118/90    Wt Readings from Last 3 Encounters:  10/24/15 222 lb (100.699 kg)  04/18/15 218 lb (98.884 kg)  12/17/14 219 lb 1.9 oz (99.392 kg)    Physical Exam  Constitutional: He is oriented to person, place, and time. No distress.  HENT:  Mouth/Throat: Oropharynx is clear and moist. No oropharyngeal exudate.  Eyes: Conjunctivae are normal. Right eye exhibits no discharge. Left eye exhibits no discharge. No scleral icterus.  Neck: Normal range of motion. Neck supple. No JVD present. No tracheal deviation present. No thyromegaly present.  Cardiovascular: Normal rate, regular rhythm, normal heart sounds and intact distal pulses.  Exam reveals no gallop and no friction rub.   No murmur heard. Pulmonary/Chest: Effort normal and breath sounds normal. No stridor. No respiratory distress. He has no wheezes. He has no rales. He exhibits no tenderness.  Abdominal: Soft. Bowel sounds are normal. He exhibits no distension and no mass. There is no tenderness. There is no rebound and no guarding.  Musculoskeletal: Normal range of motion. He exhibits no edema or tenderness.  Lymphadenopathy:    He has no cervical adenopathy.  Neurological: He is oriented to  person, place, and time.  Skin: Skin is warm and dry. No rash noted. He is not diaphoretic. No erythema. No pallor.  Vitals reviewed.   Lab Results  Component Value Date   WBC 6.5 04/15/2015   HGB 15.8 04/15/2015   HCT 47.5 04/15/2015   PLT 221.0 04/15/2015   GLUCOSE 74 04/15/2015   CHOL 139 04/15/2015   TRIG 84.0 04/15/2015   HDL 39.60 04/15/2015   LDLCALC 83  04/15/2015   ALT 14 04/15/2015   AST 14 04/15/2015   NA 140 04/15/2015   K 4.8 04/15/2015   CL 101 04/15/2015   CREATININE 0.85 04/15/2015   BUN 20 04/15/2015   CO2 30 04/15/2015   TSH 1.02 04/15/2015   PSA 1.50 04/15/2015   INR 1.13 01/31/2013   HGBA1C 6.4 10/22/2015   MICROALBUR 0.7 01/22/2014    Ct Abdomen Pelvis W Contrast  01/31/2013  *RADIOLOGY REPORT* Clinical Data: Hematuria CT ABDOMEN AND PELVIS WITH CONTRAST Technique:  Multidetector CT imaging of the abdomen and pelvis was performed following the standard protocol during bolus administration of intravenous contrast. Contrast: 46m OMNIPAQUE IOHEXOL 300 MG/ML  SOLN, 1037mOMNIPAQUE IOHEXOL 300 MG/ML  SOLN Comparison: None. Findings: Curvilinear bibasilar dependent atelectasis noted. Too small to characterize 6 mm right mid renal cortical hypodensity image 33.  3.3 cm left lower renal pole cortical cyst.  No hydronephrosis.  4 mm left lower renal pole too small to characterize hypodensity image 44.  No radiopaque renal or ureteral calculus. Scattered colonic diverticuli noted without evidence for diverticulitis.  Bladder is normal.  The appendix is not identified but there is no secondary evidence for acute appendicitis.  No bowel wall thickening or focal segmental dilatation.  Liver, gallbladder, spleen, pancreas, and adrenal glands are normal. No free air or fluid.  No lymphadenopathy.  Pleural-based calcification or right hepatic lobe granuloma image 25.  No acute osseous finding. IMPRESSION: No acute intra-abdominal or pelvic pathology. Original Report Authenticated By: GrConchita ParisM.D.    Assessment & Plan:   JaWyntonas seen today for diabetes.  Diagnoses and all orders for this visit:  Type 2 diabetes mellitus with complication, without long-term current use of insulin ([place] his blood sugars are adequately well-controlled with no medical therapy, he agrees to continue to work on his lifestyle modifications, he has been  referred for his annual diabetic eye exam. -     Ambulatory referral to Ophthalmology  Hyperlipidemia with target LDL less than 100- he is tolerating statin therapy well.   I have discontinued Mr. ReHeintzelmanetFORMIN. I am also having him maintain his omeprazole, ONETOUCH VERIO IQ SYSTEM, glucose blood, hydrocortisone, and atorvastatin.  No orders of the defined types were placed in this encounter.     Follow-up: Return in about 6 months (around 04/25/2016).  ThScarlette CalicoMD

## 2015-12-31 ENCOUNTER — Other Ambulatory Visit: Payer: Self-pay | Admitting: Internal Medicine

## 2016-01-01 ENCOUNTER — Other Ambulatory Visit: Payer: Self-pay | Admitting: Internal Medicine

## 2016-01-27 ENCOUNTER — Encounter: Payer: Self-pay | Admitting: Internal Medicine

## 2016-02-17 ENCOUNTER — Encounter: Payer: Self-pay | Admitting: Internal Medicine

## 2016-04-21 ENCOUNTER — Ambulatory Visit (AMBULATORY_SURGERY_CENTER): Payer: Self-pay | Admitting: *Deleted

## 2016-04-21 VITALS — Ht 75.0 in | Wt 226.0 lb

## 2016-04-21 DIAGNOSIS — Z86010 Personal history of colonic polyps: Secondary | ICD-10-CM

## 2016-04-21 MED ORDER — NA SULFATE-K SULFATE-MG SULF 17.5-3.13-1.6 GM/177ML PO SOLN: 1.0000 | Freq: Once | ORAL | 0 refills | Status: AC

## 2016-04-21 NOTE — Progress Notes (Signed)
No egg or soy allergy known to patient  No issues with past sedation with any surgeries  or procedures, no intubation problems  No diet pills per patient No home 02 use per patient  No blood thinners per patient  Pt denies issues with constipation  No A fib or A flutter   emmi declined'   

## 2016-04-22 ENCOUNTER — Encounter: Payer: Self-pay | Admitting: Internal Medicine

## 2016-04-24 ENCOUNTER — Telehealth: Payer: Self-pay

## 2016-04-24 ENCOUNTER — Encounter: Payer: Self-pay | Admitting: Internal Medicine

## 2016-04-24 ENCOUNTER — Other Ambulatory Visit (INDEPENDENT_AMBULATORY_CARE_PROVIDER_SITE_OTHER): Payer: 59

## 2016-04-24 DIAGNOSIS — E118 Type 2 diabetes mellitus with unspecified complications: Secondary | ICD-10-CM

## 2016-04-24 LAB — MICROALBUMIN / CREATININE URINE RATIO
CREATININE, U: 211.2 mg/dL
MICROALB UR: 1.3 mg/dL (ref 0.0–1.9)
MICROALB/CREAT RATIO: 0.6 mg/g (ref 0.0–30.0)

## 2016-04-24 LAB — HEMOGLOBIN A1C: Hgb A1c MFr Bld: 6.3 % (ref 4.6–6.5)

## 2016-04-24 NOTE — Telephone Encounter (Signed)
error 

## 2016-04-27 ENCOUNTER — Other Ambulatory Visit (INDEPENDENT_AMBULATORY_CARE_PROVIDER_SITE_OTHER): Payer: 59

## 2016-04-27 ENCOUNTER — Ambulatory Visit (INDEPENDENT_AMBULATORY_CARE_PROVIDER_SITE_OTHER): Payer: 59 | Admitting: Internal Medicine

## 2016-04-27 ENCOUNTER — Encounter: Payer: Self-pay | Admitting: Internal Medicine

## 2016-04-27 VITALS — BP 118/82 | HR 76 | Temp 97.6°F | Resp 16 | Wt 224.0 lb

## 2016-04-27 DIAGNOSIS — Z0000 Encounter for general adult medical examination without abnormal findings: Secondary | ICD-10-CM

## 2016-04-27 DIAGNOSIS — E118 Type 2 diabetes mellitus with unspecified complications: Secondary | ICD-10-CM | POA: Diagnosis not present

## 2016-04-27 LAB — CBC WITH DIFFERENTIAL/PLATELET
Basophils Absolute: 0 10*3/uL (ref 0.0–0.1)
Basophils Relative: 0.4 % (ref 0.0–3.0)
EOS ABS: 0.1 10*3/uL (ref 0.0–0.7)
Eosinophils Relative: 2.1 % (ref 0.0–5.0)
HCT: 46.9 % (ref 39.0–52.0)
Hemoglobin: 16 g/dL (ref 13.0–17.0)
LYMPHS ABS: 2 10*3/uL (ref 0.7–4.0)
Lymphocytes Relative: 31.4 % (ref 12.0–46.0)
MCHC: 34.1 g/dL (ref 30.0–36.0)
MCV: 89.1 fl (ref 78.0–100.0)
Monocytes Absolute: 0.5 10*3/uL (ref 0.1–1.0)
Monocytes Relative: 8.5 % (ref 3.0–12.0)
NEUTROS ABS: 3.6 10*3/uL (ref 1.4–7.7)
NEUTROS PCT: 57.6 % (ref 43.0–77.0)
PLATELETS: 217 10*3/uL (ref 150.0–400.0)
RBC: 5.27 Mil/uL (ref 4.22–5.81)
RDW: 13.4 % (ref 11.5–15.5)
WBC: 6.3 10*3/uL (ref 4.0–10.5)

## 2016-04-27 LAB — URINALYSIS, ROUTINE W REFLEX MICROSCOPIC
Bilirubin Urine: NEGATIVE
KETONES UR: NEGATIVE
Leukocytes, UA: NEGATIVE
Nitrite: NEGATIVE
PH: 5.5 (ref 5.0–8.0)
SPECIFIC GRAVITY, URINE: 1.025 (ref 1.000–1.030)
Total Protein, Urine: NEGATIVE
UROBILINOGEN UA: 0.2 (ref 0.0–1.0)
Urine Glucose: NEGATIVE

## 2016-04-27 LAB — COMPREHENSIVE METABOLIC PANEL
ALT: 17 U/L (ref 0–53)
AST: 15 U/L (ref 0–37)
Albumin: 4.7 g/dL (ref 3.5–5.2)
Alkaline Phosphatase: 54 U/L (ref 39–117)
BILIRUBIN TOTAL: 0.8 mg/dL (ref 0.2–1.2)
BUN: 21 mg/dL (ref 6–23)
CHLORIDE: 104 meq/L (ref 96–112)
CO2: 31 meq/L (ref 19–32)
Calcium: 10.5 mg/dL (ref 8.4–10.5)
Creatinine, Ser: 0.95 mg/dL (ref 0.40–1.50)
GFR: 85.74 mL/min (ref >60.00)
GLUCOSE: 118 mg/dL — AB (ref 70–99)
Potassium: 4.8 mEq/L (ref 3.5–5.1)
Sodium: 142 mEq/L (ref 135–145)
Total Protein: 7.3 g/dL (ref 6.0–8.3)

## 2016-04-27 LAB — LIPID PANEL
CHOL/HDL RATIO: 3
Cholesterol: 167 mg/dL (ref 0–200)
HDL: 50.1 mg/dL (ref >39.00)
LDL CALC: 102 mg/dL — AB (ref 0–99)
NONHDL: 117.15
Triglycerides: 75 mg/dL (ref 0.0–149.0)
VLDL: 15 mg/dL (ref 0.0–40.0)

## 2016-04-27 LAB — TSH: TSH: 0.69 u[IU]/mL (ref 0.35–4.50)

## 2016-04-27 LAB — PSA: PSA: 1.03 ng/mL (ref 0.10–4.00)

## 2016-04-27 NOTE — Patient Instructions (Signed)

## 2016-04-27 NOTE — Progress Notes (Signed)
Subjective:  Patient ID: Randy Rodriguez, male    DOB: 12-05-1955  Age: 60 y.o. MRN: [id number]  CC: Annual Exam; Hyperlipidemia; Hypertension; and Diabetes   HPI Randy Rodriguez presents for a CPX and follow-up on several medical problems. He has had a few episodes of knee pain but otherwise feels well and offers no complaints. He tells me his blood sugars have been well controlled. He has had no recent episodes of chest pain or shortness of breath. He is tolerating his statin medicine well with no muscle aches.  Outpatient Medications Prior to Visit  Medication Sig Dispense Refill  . atorvastatin (LIPITOR) 40 MG tablet Take 1 tablet by mouth  daily 90 tablet 2  . Blood Glucose Monitoring Suppl (ONETOUCH VERIO IQ SYSTEM) W/DEVICE KIT 1 Act by Does not apply route 2 (two) times daily. 1 kit 0  . glucose blood test strip Use BID 100 each 11  . hydrocortisone 2.5 % cream APPLY TWICE DAILY TO AFFECTED AREAS ON FACE AS NEEDED FOR RASH  2  . metFORMIN (GLUCOPHAGE) 1000 MG tablet Take 1 tablet by mouth two  times daily with meals 180 tablet 2  . omeprazole (PRILOSEC) 20 MG capsule Take 20 mg by mouth daily.       No facility-administered medications prior to visit.     ROS Review of Systems  Constitutional: Negative for activity change, appetite change, diaphoresis, fatigue and unexpected weight change.  HENT: Negative.   Eyes: Negative for visual disturbance.  Respiratory: Negative for cough, chest tightness and shortness of breath.   Cardiovascular: Negative.  Negative for chest pain and leg swelling.  Gastrointestinal: Negative.  Negative for abdominal pain, constipation, diarrhea, nausea and vomiting.  Endocrine: Negative.  Negative for cold intolerance, heat intolerance, polydipsia, polyphagia and polyuria.  Genitourinary: Negative.  Negative for difficulty urinating, dysuria, scrotal swelling and testicular pain.  Musculoskeletal: Negative.  Negative for arthralgias, back pain, myalgias and  neck pain.  Skin: Negative.  Negative for color change and rash.  Allergic/Immunologic: Negative.   Neurological: Negative.  Negative for dizziness.  Hematological: Negative.  Negative for adenopathy. Does not bruise/bleed easily.  Psychiatric/Behavioral: Negative.     Objective:  BP 118/82   Pulse 76   Temp 97.6 F (36.4 C)   Resp 16   Wt 224 lb (101.6 kg)   SpO2 96%   BMI 28.00 kg/m   BP Readings from Last 3 Encounters:  04/27/16 118/82  10/24/15 106/80  04/18/15 128/88    Wt Readings from Last 3 Encounters:  04/27/16 224 lb (101.6 kg)  04/21/16 226 lb (102.5 kg)  10/24/15 222 lb (100.7 kg)    Physical Exam  Constitutional: He is oriented to person, place, and time. No distress.  HENT:  Head: Normocephalic and atraumatic.  Mouth/Throat: Oropharynx is clear and moist. No oropharyngeal exudate.  Eyes: Conjunctivae are normal. Right eye exhibits no discharge. Left eye exhibits no discharge. No scleral icterus.  Neck: Normal range of motion. Neck supple. No JVD present. No tracheal deviation present. No thyromegaly present.  Cardiovascular: Normal rate, regular rhythm, normal heart sounds and intact distal pulses.  Exam reveals no gallop and no friction rub.   No murmur heard. Pulmonary/Chest: Effort normal and breath sounds normal. No stridor. No respiratory distress. He has no wheezes. He has no rales.  Abdominal: Soft. Bowel sounds are normal. He exhibits no distension and no mass. There is no tenderness. There is no rebound and no guarding. Hernia confirmed negative in the right  inguinal area and confirmed negative in the left inguinal area.  Genitourinary: Rectum normal, prostate normal, testes normal and penis normal. Rectal exam shows no external hemorrhoid, no internal hemorrhoid, no fissure, no mass, no tenderness and guaiac negative stool. Prostate is not enlarged and not tender. Right testis shows no mass, no swelling and no tenderness. Right testis is descended.  Left testis shows no mass, no swelling and no tenderness. Left testis is descended. Circumcised. No penile erythema or penile tenderness. No discharge found.  Musculoskeletal: Normal range of motion. He exhibits no edema, tenderness or deformity.  Lymphadenopathy:    He has no cervical adenopathy.       Right: No inguinal adenopathy present.       Left: No inguinal adenopathy present.  Neurological: He is oriented to person, place, and time.  Skin: Skin is warm and dry. No rash noted. He is not diaphoretic. No erythema. No pallor.  Psychiatric: He has a normal mood and affect. His behavior is normal. Judgment and thought content normal.  Vitals reviewed.   Lab Results  Component Value Date   WBC 6.3 04/27/2016   HGB 16.0 04/27/2016   HCT 46.9 04/27/2016   PLT 217.0 04/27/2016   GLUCOSE 118 (H) 04/27/2016   CHOL 167 04/27/2016   TRIG 75.0 04/27/2016   HDL 50.10 04/27/2016   LDLCALC 102 (H) 04/27/2016   ALT 17 04/27/2016   AST 15 04/27/2016   NA 142 04/27/2016   K 4.8 04/27/2016   CL 104 04/27/2016   CREATININE 0.95 04/27/2016   BUN 21 04/27/2016   CO2 31 04/27/2016   TSH 0.69 04/27/2016   PSA 1.03 04/27/2016   INR 1.13 01/31/2013   HGBA1C 6.3 04/24/2016   MICROALBUR 1.3 04/24/2016    Ct Abdomen Pelvis W Contrast  Result Date: 01/31/2013 *RADIOLOGY REPORT* Clinical Data: Hematuria CT ABDOMEN AND PELVIS WITH CONTRAST Technique:  Multidetector CT imaging of the abdomen and pelvis was performed following the standard protocol during bolus administration of intravenous contrast. Contrast: 76m OMNIPAQUE IOHEXOL 300 MG/ML  SOLN, 1064mOMNIPAQUE IOHEXOL 300 MG/ML  SOLN Comparison: None. Findings: Curvilinear bibasilar dependent atelectasis noted. Too small to characterize 6 mm right mid renal cortical hypodensity image 33.  3.3 cm left lower renal pole cortical cyst.  No hydronephrosis.  4 mm left lower renal pole too small to characterize hypodensity image 44.  No radiopaque renal or  ureteral calculus. Scattered colonic diverticuli noted without evidence for diverticulitis.  Bladder is normal.  The appendix is not identified but there is no secondary evidence for acute appendicitis.  No bowel wall thickening or focal segmental dilatation.  Liver, gallbladder, spleen, pancreas, and adrenal glands are normal. No free air or fluid.  No lymphadenopathy.  Pleural-based calcification or right hepatic lobe granuloma image 25.  No acute osseous finding. IMPRESSION: No acute intra-abdominal or pelvic pathology. Original Report Authenticated By: GrConchita ParisM.D.    Assessment & Plan:   JaLevesteras seen today for annual exam, hyperlipidemia, hypertension and diabetes.  Diagnoses and all orders for this visit:  Type 2 diabetes mellitus with complication, without long-term current use of insulin (HCReubens his recent A1c was down to 6.3%, he has stopped taking metformin and is been able to control his blood sugars with lifestyle modifications. He is due for his annual exam. -     Ambulatory referral to Ophthalmology  Routine general medical examination at a health care facility- exam completed, labs ordered and reviewed, vaccines reviewed and updated, his  colonoscopy is up-to-date, patient education material was given. -     CBC with Differential/Platelet; Future -     Comprehensive metabolic panel; Future -     Lipid panel; Future -     TSH; Future -     Urinalysis, Routine w reflex microscopic (not at [facility]); Future -     Hepatitis C antibody; Future -     HIV antibody; Future -     Cancel: Ambulatory referral to Ophthalmology -     PSA; Future   I am having Mr. Shiner maintain his omeprazole, ONETOUCH VERIO IQ SYSTEM, glucose blood, hydrocortisone, atorvastatin, and metFORMIN.  No orders of the defined types were placed in this encounter.    Follow-up: Return in about 6 months (around 10/25/2016).  Scarlette Calico, MD

## 2016-04-28 ENCOUNTER — Encounter: Payer: Self-pay | Admitting: Internal Medicine

## 2016-04-28 LAB — HIV ANTIBODY (ROUTINE TESTING W REFLEX): HIV 1&2 Ab, 4th Generation: NONREACTIVE

## 2016-04-28 LAB — HEPATITIS C ANTIBODY: HCV Ab: NEGATIVE

## 2016-04-30 ENCOUNTER — Ambulatory Visit (AMBULATORY_SURGERY_CENTER): Payer: 59 | Admitting: Internal Medicine

## 2016-04-30 ENCOUNTER — Encounter: Payer: Self-pay | Admitting: Internal Medicine

## 2016-04-30 VITALS — BP 143/92 | HR 69 | Temp 96.8°F | Resp 11 | Ht 75.0 in | Wt 226.0 lb

## 2016-04-30 DIAGNOSIS — D123 Benign neoplasm of transverse colon: Secondary | ICD-10-CM | POA: Diagnosis not present

## 2016-04-30 DIAGNOSIS — K635 Polyp of colon: Secondary | ICD-10-CM

## 2016-04-30 DIAGNOSIS — D124 Benign neoplasm of descending colon: Secondary | ICD-10-CM

## 2016-04-30 DIAGNOSIS — Z86010 Personal history of colonic polyps: Secondary | ICD-10-CM | POA: Diagnosis present

## 2016-04-30 LAB — HM COLONOSCOPY

## 2016-04-30 MED ORDER — SODIUM CHLORIDE 0.9 % IV SOLN: 500.0000 mL | INTRAVENOUS | Status: DC

## 2016-04-30 NOTE — Progress Notes (Signed)
A and O x3. Report to RN. Tolerated MAC anesthesia well. 

## 2016-04-30 NOTE — Op Note (Signed)
[place] Patient Name: Randy Rodriguez Procedure Date: 04/30/2016 9:28 AM MRN: [id number] Endoscopist: Jerene Bears , MD Age: 60 Referring MD:  Date of Birth: Aug 02, 1955 Gender: Male Account #: [id number] Procedure:                Colonoscopy Indications:              Surveillance: Personal history of adenomatous                            polyps on last colonoscopy 5 years ago Medicines:                Monitored Anesthesia Care Procedure:                Pre-Anesthesia Assessment:                           - Prior to the procedure, a History and Physical                            was performed, and patient medications and                            allergies were reviewed. The patient's tolerance of                            previous anesthesia was also reviewed. The risks                            and benefits of the procedure and the sedation                            options and risks were discussed with the patient.                            All questions were answered, and informed consent                            was obtained. Prior Anticoagulants: The patient has                            taken no previous anticoagulant or antiplatelet                            agents. ASA Grade Assessment: II - A patient with                            mild systemic disease. After reviewing the risks                            and benefits, the patient was deemed in                            satisfactory condition to undergo the procedure.  After obtaining informed consent, the colonoscope                            was passed under direct vision. Throughout the                            procedure, the patient's blood pressure, pulse, and                            oxygen saturations were monitored continuously. The                            Model CF-HQ190L [id number]) scope was introduced                            through the anus and advanced to  the the cecum,                            identified by appendiceal orifice and ileocecal                            valve. The colonoscopy was performed without                            difficulty. The patient tolerated the procedure                            well. The quality of the bowel preparation was                            good. The quality of the bowel preparation was                            good. The ileocecal valve, appendiceal orifice, and                            rectum were photographed. Scope In: 9:31:42 AM Scope Out: 9:46:55 AM Scope Withdrawal Time: 0 hours 12 minutes 37 seconds  Total Procedure Duration: 0 hours 15 minutes 13 seconds  Findings:                 The perianal exam findings include skin tags.                           Two sessile polyps were found in the transverse                            colon. The polyps were 4 to 5 mm in size. These                            polyps were removed with a cold snare. Resection                            and retrieval were complete.  A 4 mm polyp was found in the descending colon. The                            polyp was sessile. The polyp was removed with a                            cold snare. Resection and retrieval were complete.                           Multiple small and large-mouthed diverticula were                            found in the sigmoid colon, descending colon and                            transverse colon.                           Internal hemorrhoids were found during                            retroflexion. The hemorrhoids were small. Complications:            No immediate complications. Estimated Blood Loss:     Estimated blood loss was minimal. Impression:               - Perianal skin tags found on perianal exam.                           - Two 4 to 5 mm polyps in the transverse colon,                            removed with a cold snare. Resected and  retrieved.                           - One 4 mm polyp in the descending colon, removed                            with a cold snare. Resected and retrieved.                           - Moderate diverticulosis in the sigmoid colon, in                            the descending colon and in the transverse colon.                           - Internal hemorrhoids. Recommendation:           - Patient has a contact number available for                            emergencies. The signs and symptoms of potential  delayed complications were discussed with the                            patient. Return to normal activities tomorrow.                            Written discharge instructions were provided to the                            patient.                           - Resume previous diet.                           - Continue present medications.                           - Await pathology results.                           - Repeat colonoscopy is recommended for                            surveillance. The colonoscopy date will be                            determined after pathology results from today's                            exam become available for review. Jerene Bears, MD 04/30/2016 9:53:09 AM This report has been signed electronically.

## 2016-04-30 NOTE — Progress Notes (Signed)
Called to room to assist during endoscopic procedure.  Patient ID and intended procedure confirmed with present staff. Received instructions for my participation in the procedure from the performing physician.  

## 2016-04-30 NOTE — Patient Instructions (Signed)
Discharge instructions given. Handouts on polyps,diverticulosis and hemorrhoids. Resume previous medications. YOU HAD AN ENDOSCOPIC PROCEDURE TODAY AT THE [facility]:   Refer to the procedure report that was given to you for any specific questions about what was found during the examination.  If the procedure report does not answer your questions, please call your gastroenterologist to clarify.  If you requested that your care partner not be given the details of your procedure findings, then the procedure report has been included in a sealed envelope for you to review at your convenience later.  YOU SHOULD EXPECT: Some feelings of bloating in the abdomen. Passage of more gas than usual.  Walking can help get rid of the air that was put into your GI tract during the procedure and reduce the bloating. If you had a lower endoscopy (such as a colonoscopy or flexible sigmoidoscopy) you may notice spotting of blood in your stool or on the toilet paper. If you underwent a bowel prep for your procedure, you may not have a normal bowel movement for a few days.  Please Note:  You might notice some irritation and congestion in your nose or some drainage.  This is from the oxygen used during your procedure.  There is no need for concern and it should clear up in a day or so.  SYMPTOMS TO REPORT IMMEDIATELY:   Following lower endoscopy (colonoscopy or flexible sigmoidoscopy):  Excessive amounts of blood in the stool  Significant tenderness or worsening of abdominal pains  Swelling of the abdomen that is new, acute  Fever of 100F or higher   For urgent or emergent issues, a gastroenterologist can be reached at any hour by calling [phone].   DIET:  We do recommend a small meal at first, but then you may proceed to your regular diet.  Drink plenty of fluids but you should avoid alcoholic beverages for 24 hours.  ACTIVITY:  You should plan to take it easy for the rest of today and you  should NOT DRIVE or use heavy machinery until tomorrow (because of the sedation medicines used during the test).    FOLLOW UP: Our staff will call the number listed on your records the next business day following your procedure to check on you and address any questions or concerns that you may have regarding the information given to you following your procedure. If we do not reach you, we will leave a message.  However, if you are feeling well and you are not experiencing any problems, there is no need to return our call.  We will assume that you have returned to your regular daily activities without incident.  If any biopsies were taken you will be contacted by phone or by letter within the next 1-3 weeks.  Please call us at [phone] if you have not heard about the biopsies in 3 weeks.    SIGNATURES/CONFIDENTIALITY: You and/or your care partner have signed paperwork which will be entered into your electronic medical record.  These signatures attest to the fact that that the information above on your After Visit Summary has been reviewed and is understood.  Full responsibility of the confidentiality of this discharge information lies with you and/or your care-partner. 

## 2016-05-01 ENCOUNTER — Telehealth: Payer: Self-pay

## 2016-05-01 NOTE — Telephone Encounter (Signed)
  Follow up Call-  Call back number 04/30/2016  Post procedure Call Back phone  # [phone]  Permission to leave phone message Yes  Some recent data might be hidden    Patient was called for follow up after his procedure on 04/30/2016. No answer at the number given for follow up phone call. A message was left on the answering machine.

## 2016-05-01 NOTE — Telephone Encounter (Signed)
Left message on answering machine. 

## 2016-05-01 NOTE — Telephone Encounter (Signed)
  Follow up Call-  Call back number 04/30/2016  Post procedure Call Back phone  # [phone]  Permission to leave phone message Yes  Some recent data might be hidden    Patient was called for follow up after his procedure on 04/30/2016. I spoke to the patients wife and she reports that Randy Rodriguez has returned to his normal daily activities without any complications.

## 2016-05-10 ENCOUNTER — Encounter: Payer: Self-pay | Admitting: Internal Medicine

## 2016-07-04 ENCOUNTER — Emergency Department (INDEPENDENT_AMBULATORY_CARE_PROVIDER_SITE_OTHER)
Admission: EM | Admit: 2016-07-04 | Discharge: 2016-07-04 | Disposition: A | Discharge status: Home / Self Care | Payer: 59 | Source: Home / Self Care | Attending: Family Medicine | Admitting: Family Medicine

## 2016-07-04 ENCOUNTER — Encounter: Payer: Self-pay | Admitting: Emergency Medicine

## 2016-07-04 DIAGNOSIS — L089 Local infection of the skin and subcutaneous tissue, unspecified: Secondary | ICD-10-CM | POA: Diagnosis not present

## 2016-07-04 MED ORDER — DOXYCYCLINE HYCLATE 100 MG PO CAPS: 100.0000 mg | ORAL_CAPSULE | Freq: Two times a day (BID) | ORAL | 0 refills | Status: DC

## 2016-07-04 NOTE — Discharge Instructions (Signed)
°  Your should apply a warm damp washcloth for 15-20 minutes 3-4 times daily or you may sit in a warm bath to help encouraged area to drainage as the warm water opens the pores.

## 2016-07-04 NOTE — ED Triage Notes (Signed)
Pt c/o sore spot or abscess on testicle x 3 days, some pain, no drainage

## 2016-07-04 NOTE — ED Provider Notes (Signed)
CSN: [id number]     Arrival date & time 07/04/16  1043 History   First MD Initiated Contact with Patient 07/04/16 1126     Chief Complaint  Patient presents with  . Abscess   (Consider location/radiation/quality/duration/timing/severity/associated sxs/prior Treatment) HPI  Randy Rodriguez is a 61 y.o. male presenting to UC with c/o 3 days of gradually worsening sore on the Right underside of his scrotum.  He notes there has always been a small "bump" in the same area but over the last 3 days he has had worsening pain.  Pain is 2/10, worse with palpation. No bleeding or drainage. Denies fever or chills. Hx of axillary abscesses in the past.    Past Medical History:  Diagnosis Date  . BPH (benign prostatic hyperplasia) 2012   biopsy this week with ? MD at [facility]  . Diabetes mellitus   . GERD (gastroesophageal reflux disease)   . Hyperlipidemia    Past Surgical History:  Procedure Laterality Date  . COLONOSCOPY    . POLYPECTOMY    . TONSILLECTOMY  1964  . UPPER GASTROINTESTINAL ENDOSCOPY     Family History  Problem Relation Age of Onset  . Hypertension Mother   . Stroke Mother   . Prostate cancer Father   . Stomach cancer Maternal Grandfather   . Colon cancer Neg Hx   . Esophageal cancer Neg Hx   . Cancer Neg Hx   . Alcohol abuse Neg Hx   . Diabetes Neg Hx   . Drug abuse Neg Hx   . Early death Neg Hx   . Heart disease Neg Hx   . Hyperlipidemia Neg Hx   . Kidney disease Neg Hx   . Colon polyps Neg Hx   . Rectal cancer Neg Hx    Social History  Substance Use Topics  . Smoking status: Never Smoker  . Smokeless tobacco: Never Used  . Alcohol use 1.2 oz/week    2 Cans of beer per week    Review of Systems  Constitutional: Negative for chills and fever.  Gastrointestinal: Negative for diarrhea, nausea and vomiting.  Genitourinary: Positive for genital sores (Right side scrotum). Negative for dysuria, frequency, hematuria, scrotal swelling and urgency.  Skin: Positive  for color change and wound. Negative for rash.       Abscess/sore on scrotum    Allergies  Patient has no known allergies.  Home Medications   Prior to Admission medications   Medication Sig Start Date End Date Taking? Authorizing Provider  atorvastatin (LIPITOR) 40 MG tablet Take 1 tablet by mouth  daily 12/31/15   [name], MD  Blood Glucose Monitoring Suppl (ONETOUCH VERIO IQ SYSTEM) W/DEVICE KIT 1 Act by Does not apply route 2 (two) times daily. Patient not taking: Reported on 04/30/2016 08/15/14   [name], MD  doxycycline (VIBRAMYCIN) 100 MG capsule Take 1 capsule (100 mg total) by mouth 2 (two) times daily. One po bid x 7 days 07/04/16   Noland Fordyce, PA-C  glucose blood test strip Use BID Patient not taking: Reported on 04/30/2016 08/15/14   [name], MD  omeprazole (PRILOSEC) 20 MG capsule Take 20 mg by mouth daily.      Historical Provider, MD   Meds Ordered and Administered this Visit  Medications - No data to display  BP 147/86 (BP Location: Left Arm)   Pulse 72   Temp 98.3 F (36.8 C) (Oral)   Ht 6' 2"  (1.88 m)   Wt 228  lb 8 oz (103.6 kg)   SpO2 93%   BMI 29.34 kg/m  No data found.   Physical Exam  Constitutional: He is oriented to person, place, and time. He appears well-developed and well-nourished. No distress.  HENT:  Head: Normocephalic and atraumatic.  Eyes: EOM are normal.  Neck: Normal range of motion.  Cardiovascular: Normal rate.   Pulmonary/Chest: Effort normal.  Genitourinary:  Genitourinary Comments: Scrotum: no swelling: Right side- 81m pustule. Mildly tender. No active bleeding or drainage.  Musculoskeletal: Normal range of motion.  Neurological: He is alert and oriented to person, place, and time.  Skin: Skin is warm and dry. He is not diaphoretic.  Psychiatric: He has a normal mood and affect. His behavior is normal.  Nursing note and vitals reviewed.   Urgent Care Course   Clinical Course     Procedures (including  critical care time)  Labs Review Labs Reviewed - No data to display  Imaging Review No results found.    MDM   1. Skin pustule    Skin pustule noted on Right side of scrotum. Will hold off on I&D at his time and try conservative treatment with warm compresses and PO antibiotics- doxycycline  Encouraged f/u with PCP in 5-7 days if not improving, sooner if worsening, or return to UC as needed.    ENoland Fordyce PA-C 07/04/16 1221

## 2016-09-07 ENCOUNTER — Other Ambulatory Visit: Payer: Self-pay | Admitting: Internal Medicine

## 2016-09-10 ENCOUNTER — Other Ambulatory Visit: Payer: Self-pay | Admitting: Internal Medicine

## 2016-09-24 ENCOUNTER — Encounter: Payer: Self-pay | Admitting: Internal Medicine

## 2016-09-24 ENCOUNTER — Ambulatory Visit (INDEPENDENT_AMBULATORY_CARE_PROVIDER_SITE_OTHER): Payer: 59 | Admitting: Internal Medicine

## 2016-09-24 VITALS — BP 124/86 | HR 74 | Temp 97.7°F | Resp 16 | Ht 74.0 in | Wt 225.1 lb

## 2016-09-24 DIAGNOSIS — R3129 Other microscopic hematuria: Secondary | ICD-10-CM

## 2016-09-24 DIAGNOSIS — J069 Acute upper respiratory infection, unspecified: Secondary | ICD-10-CM | POA: Insufficient documentation

## 2016-09-24 DIAGNOSIS — R319 Hematuria, unspecified: Secondary | ICD-10-CM | POA: Insufficient documentation

## 2016-09-24 DIAGNOSIS — B9789 Other viral agents as the cause of diseases classified elsewhere: Secondary | ICD-10-CM

## 2016-09-24 DIAGNOSIS — E118 Type 2 diabetes mellitus with unspecified complications: Secondary | ICD-10-CM

## 2016-09-24 LAB — POC URINALSYSI DIPSTICK (AUTOMATED)
Bilirubin, UA: NEGATIVE
Glucose, UA: NEGATIVE
Ketones, UA: NEGATIVE
Leukocytes, UA: NEGATIVE
NITRITE UA: NEGATIVE
Protein, UA: NEGATIVE
RBC UA: NEGATIVE
Spec Grav, UA: 1.03 (ref 1.030–1.035)
UROBILINOGEN UA: 0.2 (ref ?–2.0)
pH, UA: 6 (ref 5.0–8.0)

## 2016-09-24 LAB — POCT GLYCOSYLATED HEMOGLOBIN (HGB A1C): HEMOGLOBIN A1C: 6.2

## 2016-09-24 MED ORDER — HYDROCODONE-HOMATROPINE 5-1.5 MG/5ML PO SYRP: 5.0000 mL | ORAL_SOLUTION | Freq: Three times a day (TID) | ORAL | 0 refills | Status: DC | PRN

## 2016-09-24 NOTE — Patient Instructions (Signed)
Cough, Adult Coughing is a reflex that clears your throat and your airways. Coughing helps to heal and protect your lungs. It is normal to cough occasionally, but a cough that happens with other symptoms or lasts a long time may be a sign of a condition that needs treatment. A cough may last only 2-3 weeks (acute), or it may last longer than 8 weeks (chronic). What are the causes? Coughing is commonly caused by:  Breathing in substances that irritate your lungs.  A viral or bacterial respiratory infection.  Allergies.  Asthma.  Postnasal drip.  Smoking.  Acid backing up from the stomach into the esophagus (gastroesophageal reflux).  Certain medicines.  Chronic lung problems, including COPD (or rarely, lung cancer).  Other medical conditions such as heart failure.  Follow these instructions at home: Pay attention to any changes in your symptoms. Take these actions to help with your discomfort:  Take medicines only as told by your health care provider. ? If you were prescribed an antibiotic medicine, take it as told by your health care provider. Do not stop taking the antibiotic even if you start to feel better. ? Talk with your health care provider before you take a cough suppressant medicine.  Drink enough fluid to keep your urine clear or pale yellow.  If the air is dry, use a cold steam vaporizer or humidifier in your bedroom or your home to help loosen secretions.  Avoid anything that causes you to cough at work or at home.  If your cough is worse at night, try sleeping in a semi-upright position.  Avoid cigarette smoke. If you smoke, quit smoking. If you need help quitting, ask your health care provider.  Avoid caffeine.  Avoid alcohol.  Rest as needed.  Contact a health care provider if:  You have new symptoms.  You cough up pus.  Your cough does not get better after 2-3 weeks, or your cough gets worse.  You cannot control your cough with suppressant  medicines and you are losing sleep.  You develop pain that is getting worse or pain that is not controlled with pain medicines.  You have a fever.  You have unexplained weight loss.  You have night sweats. Get help right away if:  You cough up blood.  You have difficulty breathing.  Your heartbeat is very fast. This information is not intended to replace advice given to you by your health care provider. Make sure you discuss any questions you have with your health care provider. Document Released: 12/05/2010 Document Revised: 11/14/2015 Document Reviewed: 08/15/2014 Elsevier Interactive Patient Education  2017 Elsevier Inc.  

## 2016-09-24 NOTE — Progress Notes (Signed)
Subjective:  Patient ID: Randy Rodriguez, male    DOB: [birth year]  Age: 61 y.o. MRN: [id number]  CC: URI and Diabetes   HPI Randy Rodriguez presents for f/up - He complains of a one-week history of scratchy throat, nonproductive cough, the cough keeps him awake at night, nasal congestion, and runny nose.  Outpatient Medications Prior to Visit  Medication Sig Dispense Refill  . atorvastatin (LIPITOR) 40 MG tablet TAKE 1 TABLET BY MOUTH  DAILY 90 tablet 2  . Blood Glucose Monitoring Suppl (ONETOUCH VERIO IQ SYSTEM) W/DEVICE KIT 1 Act by Does not apply route 2 (two) times daily. 1 kit 0  . glucose blood test strip Use BID 100 each 11  . metFORMIN (GLUCOPHAGE) 1000 MG tablet TAKE 1 TABLET BY MOUTH TWO  TIMES DAILY WITH MEALS 180 tablet 2  . omeprazole (PRILOSEC) 20 MG capsule Take 20 mg by mouth daily.      Marland Kitchen doxycycline (VIBRAMYCIN) 100 MG capsule Take 1 capsule (100 mg total) by mouth 2 (two) times daily. One po bid x 7 days 14 capsule 0  . 0.9 %  sodium chloride infusion      No facility-administered medications prior to visit.     ROS Review of Systems  Constitutional: Negative for chills, diaphoresis, fatigue and fever.  HENT: Positive for congestion, postnasal drip, rhinorrhea and sore throat. Negative for nosebleeds, sinus pain, sinus pressure, sneezing, trouble swallowing and voice change.   Eyes: Negative.   Respiratory: Positive for cough. Negative for chest tightness, shortness of breath and wheezing.   Cardiovascular: Negative for chest pain, palpitations and leg swelling.  Gastrointestinal: Negative.  Negative for abdominal pain, constipation, diarrhea, nausea and vomiting.  Endocrine: Negative.  Negative for polydipsia, polyphagia and polyuria.  Genitourinary: Negative.  Negative for difficulty urinating, dysuria, flank pain, frequency, hematuria, penile swelling, scrotal swelling, testicular pain and urgency.  Musculoskeletal: Negative.  Negative for back pain, myalgias and neck  pain.  Skin: Negative.  Negative for color change and rash.  Allergic/Immunologic: Negative.   Neurological: Negative.  Negative for dizziness.  Hematological: Negative for adenopathy. Does not bruise/bleed easily.  Psychiatric/Behavioral: Negative.     Objective:  BP 124/86 (BP Location: Right Arm, Patient Position: Sitting, Cuff Size: Normal)   Pulse 74   Temp 97.7 F (36.5 C) (Oral)   Resp 16   Ht 6' 2"  (1.88 m)   Wt 225 lb 1.3 oz (102.1 kg)   SpO2 94%   BMI 28.90 kg/m   BP Readings from Last 3 Encounters:  09/24/16 124/86  07/04/16 147/86  04/30/16 (!) 143/92    Wt Readings from Last 3 Encounters:  09/24/16 225 lb 1.3 oz (102.1 kg)  07/04/16 228 lb 8 oz (103.6 kg)  04/30/16 226 lb (102.5 kg)    Physical Exam  Constitutional: He is oriented to person, place, and time. No distress.  HENT:  Nose: No mucosal edema or rhinorrhea. Right sinus exhibits no maxillary sinus tenderness and no frontal sinus tenderness. Left sinus exhibits no maxillary sinus tenderness and no frontal sinus tenderness.  Mouth/Throat: Oropharynx is clear and moist and mucous membranes are normal. Mucous membranes are not pale, not dry and not cyanotic. No oral lesions. No trismus in the jaw. No uvula swelling. No oropharyngeal exudate, posterior oropharyngeal edema, posterior oropharyngeal erythema or tonsillar abscesses.  Eyes: Conjunctivae are normal. Right eye exhibits no discharge. Left eye exhibits no discharge. No scleral icterus.  Neck: Normal range of motion. Neck supple. No JVD present. No  tracheal deviation present. No thyromegaly present.  Cardiovascular: Normal rate, regular rhythm, normal heart sounds and intact distal pulses.  Exam reveals no gallop and no friction rub.   No murmur heard. Pulmonary/Chest: Effort normal and breath sounds normal. No stridor. No respiratory distress. He has no wheezes. He has no rales. He exhibits no tenderness.  Abdominal: Soft. Bowel sounds are normal. He  exhibits no distension and no mass. There is no tenderness. There is no rebound and no guarding.  Musculoskeletal: Normal range of motion. He exhibits no edema or deformity.  Lymphadenopathy:    He has no cervical adenopathy.  Neurological: He is oriented to person, place, and time.  Skin: Skin is warm. No rash noted. He is not diaphoretic. No erythema. No pallor.  Vitals reviewed.   Lab Results  Component Value Date   WBC 6.3 04/27/2016   HGB 16.0 04/27/2016   HCT 46.9 04/27/2016   PLT 217.0 04/27/2016   GLUCOSE 118 (H) 04/27/2016   CHOL 167 04/27/2016   TRIG 75.0 04/27/2016   HDL 50.10 04/27/2016   LDLCALC 102 (H) 04/27/2016   ALT 17 04/27/2016   AST 15 04/27/2016   NA 142 04/27/2016   K 4.8 04/27/2016   CL 104 04/27/2016   CREATININE 0.95 04/27/2016   BUN 21 04/27/2016   CO2 31 04/27/2016   TSH 0.69 04/27/2016   PSA 1.03 04/27/2016   INR 1.13 01/31/2013   HGBA1C 6.2 09/24/2016   MICROALBUR 1.3 04/24/2016    No results found.  Assessment & Plan:   Randy Rodriguez was seen today for uri and diabetes.  Diagnoses and all orders for this visit:  Type 2 diabetes mellitus with complication, without long-term current use of insulin ([place])- his A1c is down to 6.2%, his blood sugars are adequately well-controlled with metformin -     POCT HgB A1C  Other microscopic hematuria- he has no symptoms related to this and his urinalysis is normal  Viral URI with cough- will control the cough with Hycodan -     HYDROcodone-homatropine (HYCODAN) 5-1.5 MG/5ML syrup; Take 5 mLs by mouth every 8 (eight) hours as needed for cough.   I have discontinued Mr. Salmons doxycycline. I am also having him start on HYDROcodone-homatropine. Additionally, I am having him maintain his omeprazole, ONETOUCH VERIO IQ SYSTEM, glucose blood, atorvastatin, and metFORMIN. We will stop administering sodium chloride.  Meds ordered this encounter  Medications  . HYDROcodone-homatropine (HYCODAN) 5-1.5 MG/5ML syrup      Sig: Take 5 mLs by mouth every 8 (eight) hours as needed for cough.    Dispense:  120 mL    Refill:  0     Follow-up: No Follow-up on file.  Scarlette Calico, MD

## 2016-09-24 NOTE — Progress Notes (Signed)
Pre visit review using our clinic review tool, if applicable. No additional management support is needed unless otherwise documented below in the visit note. 

## 2017-03-29 ENCOUNTER — Ambulatory Visit: Payer: 59 | Admitting: Internal Medicine

## 2017-04-05 ENCOUNTER — Other Ambulatory Visit: Payer: Self-pay

## 2017-04-05 ENCOUNTER — Other Ambulatory Visit (INDEPENDENT_AMBULATORY_CARE_PROVIDER_SITE_OTHER): Payer: PRIVATE HEALTH INSURANCE

## 2017-04-05 ENCOUNTER — Encounter: Payer: Self-pay | Admitting: Internal Medicine

## 2017-04-05 ENCOUNTER — Ambulatory Visit (INDEPENDENT_AMBULATORY_CARE_PROVIDER_SITE_OTHER): Payer: PRIVATE HEALTH INSURANCE | Admitting: Internal Medicine

## 2017-04-05 VITALS — BP 134/80 | HR 76 | Temp 98.4°F | Resp 16 | Ht 74.0 in | Wt 241.0 lb

## 2017-04-05 DIAGNOSIS — Z23 Encounter for immunization: Secondary | ICD-10-CM | POA: Diagnosis not present

## 2017-04-05 DIAGNOSIS — E785 Hyperlipidemia, unspecified: Secondary | ICD-10-CM

## 2017-04-05 DIAGNOSIS — E118 Type 2 diabetes mellitus with unspecified complications: Secondary | ICD-10-CM

## 2017-04-05 DIAGNOSIS — R3129 Other microscopic hematuria: Secondary | ICD-10-CM

## 2017-04-05 LAB — COMPREHENSIVE METABOLIC PANEL
ALT: 19 U/L (ref 0–53)
AST: 17 U/L (ref 0–37)
Albumin: 4.3 g/dL (ref 3.5–5.2)
Alkaline Phosphatase: 63 U/L (ref 39–117)
BUN: 16 mg/dL (ref 6–23)
CO2: 27 mEq/L (ref 19–32)
Calcium: 9.7 mg/dL (ref 8.4–10.5)
Chloride: 102 mEq/L (ref 96–112)
Creatinine, Ser: 1.04 mg/dL (ref 0.40–1.50)
GFR: 77 mL/min (ref >60.00)
Glucose, Bld: 104 mg/dL — ABNORMAL HIGH (ref 70–99)
Potassium: 3.9 mEq/L (ref 3.5–5.1)
Sodium: 136 mEq/L (ref 135–145)
Total Bilirubin: 0.4 mg/dL (ref 0.2–1.2)
Total Protein: 7.4 g/dL (ref 6.0–8.3)

## 2017-04-05 LAB — LIPID PANEL
CHOL/HDL RATIO: 4
Cholesterol: 168 mg/dL (ref 0–200)
HDL: 37.7 mg/dL — ABNORMAL LOW (ref >39.00)
LDL Cholesterol: 96 mg/dL (ref 0–99)
NONHDL: 129.85
Triglycerides: 169 mg/dL — ABNORMAL HIGH (ref 0.0–149.0)
VLDL: 33.8 mg/dL (ref 0.0–40.0)

## 2017-04-05 LAB — MICROALBUMIN / CREATININE URINE RATIO
CREATININE, U: 90.5 mg/dL
Microalb Creat Ratio: 0.8 mg/g (ref 0.0–30.0)
Microalb, Ur: <0.7 mg/dL (ref 0.0–1.9)

## 2017-04-05 NOTE — Progress Notes (Signed)
Subjective:  Patient ID: Randy Rodriguez, male    DOB: Jan 25, 1956  Age: 61 y.o. MRN: [id number]  CC: Diabetes and Hyperlipidemia   HPI Randy Rodriguez presents for f/up - he feels well, offers no complaints.  Outpatient Medications Prior to Visit  Medication Sig Dispense Refill  . atorvastatin (LIPITOR) 40 MG tablet TAKE 1 TABLET BY MOUTH  DAILY 90 tablet 2  . Blood Glucose Monitoring Suppl (ONETOUCH VERIO IQ SYSTEM) W/DEVICE KIT 1 Act by Does not apply route 2 (two) times daily. 1 kit 0  . glucose blood test strip Use BID 100 each 11  . metFORMIN (GLUCOPHAGE) 1000 MG tablet TAKE 1 TABLET BY MOUTH TWO  TIMES DAILY WITH MEALS 180 tablet 2  . omeprazole (PRILOSEC) 20 MG capsule Take 20 mg by mouth daily.      Marland Kitchen HYDROcodone-homatropine (HYCODAN) 5-1.5 MG/5ML syrup Take 5 mLs by mouth every 8 (eight) hours as needed for cough. 120 mL 0   No facility-administered medications prior to visit.     ROS Review of Systems  Constitutional: Negative.  Negative for chills, diaphoresis, fatigue and fever.  HENT: Negative.   Eyes: Negative for visual disturbance.  Respiratory: Negative for cough, chest tightness, shortness of breath and wheezing.   Cardiovascular: Negative for chest pain, palpitations and leg swelling.  Gastrointestinal: Negative for abdominal pain, constipation, diarrhea, nausea and vomiting.  Endocrine: Negative.   Genitourinary: Negative.  Negative for difficulty urinating, dysuria, flank pain, frequency, hematuria, scrotal swelling, testicular pain and urgency.  Musculoskeletal: Negative.  Negative for arthralgias and myalgias.  Skin: Negative.  Negative for color change.  Allergic/Immunologic: Negative.   Neurological: Negative.   Hematological: Negative for adenopathy. Does not bruise/bleed easily.  Psychiatric/Behavioral: Negative.     Objective:  BP 134/80 (BP Location: Left Arm, Patient Position: Sitting, Cuff Size: Normal)   Pulse 76   Temp 98.4 F (36.9 C) (Oral)    Resp 16   Ht 6' 2"  (1.88 m)   Wt 241 lb (109.3 kg)   SpO2 94%   BMI 30.94 kg/m   BP Readings from Last 3 Encounters:  04/05/17 134/80  09/24/16 124/86  07/04/16 147/86    Wt Readings from Last 3 Encounters:  04/05/17 241 lb (109.3 kg)  09/24/16 225 lb 1.3 oz (102.1 kg)  07/04/16 228 lb 8 oz (103.6 kg)    Physical Exam  Constitutional: He is oriented to person, place, and time. No distress.  HENT:  Mouth/Throat: Oropharynx is clear and moist. No oropharyngeal exudate.  Eyes: Conjunctivae are normal. Right eye exhibits no discharge. Left eye exhibits no discharge. No scleral icterus.  Neck: Normal range of motion. Neck supple. No JVD present. No thyromegaly present.  Cardiovascular: Normal rate, regular rhythm and intact distal pulses.  Exam reveals no gallop and no friction rub.   No murmur heard. Pulmonary/Chest: Effort normal and breath sounds normal. No respiratory distress. He has no wheezes. He has no rales. He exhibits no tenderness.  Abdominal: Soft. Bowel sounds are normal. He exhibits no distension and no mass. There is no tenderness. There is no rebound and no guarding.  Musculoskeletal: Normal range of motion. He exhibits no edema, tenderness or deformity.  Lymphadenopathy:    He has no cervical adenopathy.  Neurological: He is alert and oriented to person, place, and time.  Skin: Skin is warm and dry. No rash noted. He is not diaphoretic. No erythema. No pallor.  Vitals reviewed.   Lab Results  Component Value Date  WBC 6.3 04/27/2016   HGB 16.0 04/27/2016   HCT 46.9 04/27/2016   PLT 217.0 04/27/2016   GLUCOSE 104 (H) 04/05/2017   CHOL 168 04/05/2017   TRIG 169.0 (H) 04/05/2017   HDL 37.70 (L) 04/05/2017   LDLCALC 96 04/05/2017   ALT 19 04/05/2017   AST 17 04/05/2017   NA 136 04/05/2017   K 3.9 04/05/2017   CL 102 04/05/2017   CREATININE 1.04 04/05/2017   BUN 16 04/05/2017   CO2 27 04/05/2017   TSH 0.69 04/27/2016   PSA 1.03 04/27/2016   INR 1.13  01/31/2013   HGBA1C 6.7 (H) 04/05/2017   MICROALBUR <0.7 04/05/2017    No results found.  Assessment & Plan:   Randy Rodriguez was seen today for diabetes and hyperlipidemia.  Diagnoses and all orders for this visit:  Need for influenza vaccination -     Flu Vaccine QUAD 36+ mos IM  Type 2 diabetes mellitus with complication, without long-term current use of insulin ([place])- His A1c is 6.7%. His blood sugars are adequately well controlled. -     Comprehensive metabolic panel; Future -     Hemoglobin A1c; Future -     Microalbumin / creatinine urine ratio; Future -     Ambulatory referral to Ophthalmology  Other microscopic hematuria- there is no blood in his urine now and he is asymptomatic. -     Urinalysis, Routine w reflex microscopic; Future  Hyperlipidemia with target LDL less than 100- he has achieved his LDL goal is doing well on the statin. -     Lipid panel; Future   I have discontinued Randy Rodriguez HYDROcodone-homatropine. I am also having him maintain his omeprazole, ONETOUCH VERIO IQ SYSTEM, glucose blood, atorvastatin, and metFORMIN.  No orders of the defined types were placed in this encounter.    Follow-up: Return in about 4 months (around 08/06/2017).  Scarlette Calico, MD

## 2017-04-05 NOTE — Telephone Encounter (Signed)
Error

## 2017-04-05 NOTE — Patient Instructions (Signed)
Diabetes Mellitus and Standards of Medical Care Managing diabetes (diabetes mellitus) can be complicated. Your diabetes treatment may be managed by a team of health care providers, including:  A diet and nutrition specialist (registered dietitian).  A nurse.  A certified diabetes educator (CDE).  A diabetes specialist (endocrinologist).  An eye doctor.  A primary care provider.  A dentist.  Your health care providers follow a schedule in order to help you get the best quality of care. The following schedule is a general guideline for your diabetes management plan. Your health care providers may also give you more specific instructions. HbA1c ( hemoglobin A1c) test This test provides information about blood sugar (glucose) control over the previous 2-3 months. It is used to check whether your diabetes management plan needs to be adjusted.  If you are meeting your treatment goals, this test is done at least 2 times a year.  If you are not meeting treatment goals or if your treatment goals have changed, this test is done 4 times a year.  Blood pressure test  This test is done at every routine medical visit. For most people, the goal is less than 130/80. Ask your health care provider what your goal blood pressure should be. Dental and eye exams  Visit your dentist two times a year.  If you have type 1 diabetes, get an eye exam 3-5 years after you are diagnosed, and then once a year after your first exam. ? If you were diagnosed with type 1 diabetes as a child, get an eye exam when you are age 16 or older and have had diabetes for 3-5 years. After the first exam, you should get an eye exam once a year.  If you have type 2 diabetes, have an eye exam as soon as you are diagnosed, and then once a year after your first exam. Foot care exam  Visual foot exams are done at every routine medical visit. The exams check for cuts, bruises, redness, blisters, sores, or other problems with the  feet.  A complete foot exam is done by your health care provider once a year. This exam includes an inspection of the structure and skin of your feet, and a check of the pulses and sensation in your feet. ? Type 1 diabetes: Get your first exam 3-5 years after diagnosis. ? Type 2 diabetes: Get your first exam as soon as you are diagnosed.  Check your feet every day for cuts, bruises, redness, blisters, or sores. If you have any of these or other problems that are not healing, contact your health care provider. Kidney function test ( urine microalbumin)  This test is done once a year. ? Type 1 diabetes: Get your first test 5 years after diagnosis. ? Type 2 diabetes: Get your first test as soon as you are diagnosed.  If you have chronic kidney disease (CKD), get a serum creatinine and estimated glomerular filtration rate (eGFR) test once a year. Lipid profile (cholesterol, HDL, LDL, triglycerides)  This test should be done when you are diagnosed with diabetes, and every 5 years after the first test. If you are on medicines to lower your cholesterol, you may need to get this test done every year. ? The goal for LDL is less than 100 mg/dL (5.5 mmol/L). If you are at high risk, the goal is less than 70 mg/dL (3.9 mmol/L). ? The goal for HDL is 40 mg/dL (2.2 mmol/L) for men and 50 mg/dL(2.8 mmol/L) for women. An HDL  cholesterol of 60 mg/dL (3.3 mmol/L) or higher gives some protection against heart disease. ? The goal for triglycerides is less than 150 mg/dL (8.3 mmol/L). Immunizations  The yearly flu (influenza) vaccine is recommended for everyone 6 months or older who has diabetes.  The pneumonia (pneumococcal) vaccine is recommended for everyone 2 years or older who has diabetes. If you are 97 or older, you may get the pneumonia vaccine as a series of two separate shots.  The hepatitis B vaccine is recommended for adults shortly after they have been diagnosed with diabetes.  The Tdap  (tetanus, diphtheria, and pertussis) vaccine should be given: ? According to normal childhood vaccination schedules, for children. ? Every 10 years, for adults who have diabetes.  The shingles vaccine is recommended for people who have had chicken pox and are 50 years or older. Mental and emotional health  Screening for symptoms of eating disorders, anxiety, and depression is recommended at the time of diagnosis and afterward as needed. If your screening shows that you have symptoms (you have a positive screening result), you may need further evaluation and be referred to a mental health care provider. Diabetes self-management education  Education about how to manage your diabetes is recommended at diagnosis and ongoing as needed. Treatment plan  Your treatment plan will be reviewed at every medical visit. Summary  Managing diabetes (diabetes mellitus) can be complicated. Your diabetes treatment may be managed by a team of health care providers.  Your health care providers follow a schedule in order to help you get the best quality of care.  Standards of care including having regular physical exams, blood tests, blood pressure monitoring, immunizations, screening tests, and education about how to manage your diabetes.  Your health care providers may also give you more specific instructions based on your individual health. This information is not intended to replace advice given to you by your health care provider. Make sure you discuss any questions you have with your health care provider. Document Released: 04/05/2009 Document Revised: 03/06/2016 Document Reviewed: 03/06/2016 Elsevier Interactive Patient Education  [employer].

## 2017-04-06 ENCOUNTER — Encounter: Payer: Self-pay | Admitting: Internal Medicine

## 2017-04-06 ENCOUNTER — Telehealth: Payer: Self-pay

## 2017-04-06 LAB — URINALYSIS, ROUTINE W REFLEX MICROSCOPIC
Bilirubin Urine: NEGATIVE
Hgb urine dipstick: NEGATIVE
KETONES UR: NEGATIVE
Leukocytes, UA: NEGATIVE
Nitrite: NEGATIVE
PH: 5.5 (ref 5.0–8.0)
RBC / HPF: NONE SEEN (ref 0–?)
SPECIFIC GRAVITY, URINE: 1.015 (ref 1.000–1.030)
Total Protein, Urine: NEGATIVE
URINE GLUCOSE: NEGATIVE
Urobilinogen, UA: 0.2 (ref 0.0–1.0)
WBC, UA: NONE SEEN (ref 0–?)

## 2017-04-06 LAB — HEMOGLOBIN A1C: HEMOGLOBIN A1C: 6.7 % — AB (ref 4.6–6.5)

## 2017-04-06 NOTE — Telephone Encounter (Signed)
-----   Message from [facility], [place] sent at 04/05/2017  4:17 PM EDT ----- Regarding: Shingles Wait List Can add him to the Shingles wait list.

## 2017-04-06 NOTE — Telephone Encounter (Signed)
Patient has been added to shingrix waitlist 

## 2017-04-20 ENCOUNTER — Ambulatory Visit (INDEPENDENT_AMBULATORY_CARE_PROVIDER_SITE_OTHER): Payer: PRIVATE HEALTH INSURANCE | Admitting: General Practice

## 2017-04-20 DIAGNOSIS — Z23 Encounter for immunization: Secondary | ICD-10-CM

## 2017-05-16 ENCOUNTER — Other Ambulatory Visit: Payer: Self-pay | Admitting: Internal Medicine

## 2017-06-18 LAB — HM DIABETES EYE EXAM

## 2017-06-29 ENCOUNTER — Ambulatory Visit (INDEPENDENT_AMBULATORY_CARE_PROVIDER_SITE_OTHER): Payer: PRIVATE HEALTH INSURANCE

## 2017-06-29 DIAGNOSIS — Z299 Encounter for prophylactic measures, unspecified: Secondary | ICD-10-CM | POA: Diagnosis not present

## 2019-08-24 ENCOUNTER — Ambulatory Visit: Payer: PRIVATE HEALTH INSURANCE | Attending: Internal Medicine

## 2019-08-24 DIAGNOSIS — Z23 Encounter for immunization: Secondary | ICD-10-CM | POA: Insufficient documentation

## 2019-08-24 NOTE — Progress Notes (Signed)
   Covid-19 Vaccination Clinic  Name:  Randy Rodriguez    MRN: [id number] DOB: [birth year]  08/24/2019  [name] was observed post Covid-19 immunization for 15 minutes without incident. He was provided with Vaccine Information Sheet and instruction to access the V-Safe system.   [name] was instructed to call 911 with any severe reactions post vaccine: Marland Kitchen Difficulty breathing  . Swelling of face and throat  . A fast heartbeat  . A bad rash all over body  . Dizziness and weakness   Immunizations Administered    Name Date Dose VIS Date Route   Moderna COVID-19 Vaccine 08/24/2019 11:10 AM 0.5 mL 05/23/2019 Intramuscular   Manufacturer: Moderna   Lot: [id number]   [id number]

## 2019-09-26 ENCOUNTER — Ambulatory Visit: Payer: PRIVATE HEALTH INSURANCE | Attending: Internal Medicine

## 2019-09-26 DIAGNOSIS — Z23 Encounter for immunization: Secondary | ICD-10-CM

## 2019-09-26 NOTE — Progress Notes (Signed)
   Covid-19 Vaccination Clinic  Name:  Randy Rodriguez    MRN: [id number] DOB: 08-02-1955  09/26/2019  Mr. Randy Rodriguez was observed post Covid-19 immunization for 15 minutes without incident. He was provided with Vaccine Information Sheet and instruction to access the V-Safe system.   Mr. Randy Rodriguez was instructed to call 911 with any severe reactions post vaccine: Marland Kitchen Difficulty breathing  . Swelling of face and throat  . A fast heartbeat  . A bad rash all over body  . Dizziness and weakness   Immunizations Administered    Name Date Dose VIS Date Route   Moderna COVID-19 Vaccine 09/26/2019 10:49 AM 0.5 mL 05/23/2019 Intramuscular   Manufacturer: Moderna   [id number]   [id number]

## 2019-12-13 ENCOUNTER — Other Ambulatory Visit: Payer: Self-pay

## 2019-12-13 ENCOUNTER — Encounter: Payer: Self-pay | Admitting: Internal Medicine

## 2019-12-13 ENCOUNTER — Other Ambulatory Visit (INDEPENDENT_AMBULATORY_CARE_PROVIDER_SITE_OTHER): Payer: PRIVATE HEALTH INSURANCE

## 2019-12-13 ENCOUNTER — Ambulatory Visit (INDEPENDENT_AMBULATORY_CARE_PROVIDER_SITE_OTHER): Payer: PRIVATE HEALTH INSURANCE | Admitting: Internal Medicine

## 2019-12-13 VITALS — BP 144/90 | HR 85 | Temp 98.2°F | Resp 16 | Ht 74.0 in | Wt 238.5 lb

## 2019-12-13 DIAGNOSIS — E118 Type 2 diabetes mellitus with unspecified complications: Secondary | ICD-10-CM

## 2019-12-13 DIAGNOSIS — Z125 Encounter for screening for malignant neoplasm of prostate: Secondary | ICD-10-CM | POA: Diagnosis not present

## 2019-12-13 DIAGNOSIS — E785 Hyperlipidemia, unspecified: Secondary | ICD-10-CM | POA: Diagnosis not present

## 2019-12-13 DIAGNOSIS — R0609 Other forms of dyspnea: Secondary | ICD-10-CM | POA: Insufficient documentation

## 2019-12-13 DIAGNOSIS — E781 Pure hyperglyceridemia: Secondary | ICD-10-CM

## 2019-12-13 DIAGNOSIS — Z0000 Encounter for general adult medical examination without abnormal findings: Secondary | ICD-10-CM

## 2019-12-13 DIAGNOSIS — I10 Essential (primary) hypertension: Secondary | ICD-10-CM | POA: Diagnosis not present

## 2019-12-13 DIAGNOSIS — R0600 Dyspnea, unspecified: Secondary | ICD-10-CM

## 2019-12-13 LAB — URINALYSIS, ROUTINE W REFLEX MICROSCOPIC
Bilirubin Urine: NEGATIVE
Ketones, ur: NEGATIVE
Leukocytes,Ua: NEGATIVE
Nitrite: NEGATIVE
Specific Gravity, Urine: 1.025 (ref 1.000–1.030)
Total Protein, Urine: NEGATIVE
Urine Glucose: 100 — AB
Urobilinogen, UA: 1 (ref 0.0–1.0)
pH: 5.5 (ref 5.0–8.0)

## 2019-12-13 LAB — CBC WITH DIFFERENTIAL/PLATELET
Basophils Absolute: 0.1 10*3/uL (ref 0.0–0.1)
Basophils Relative: 1 % (ref 0.0–3.0)
Eosinophils Absolute: 0.2 10*3/uL (ref 0.0–0.7)
Eosinophils Relative: 2.6 % (ref 0.0–5.0)
HCT: 45.9 % (ref 39.0–52.0)
Hemoglobin: 15.9 g/dL (ref 13.0–17.0)
Lymphocytes Relative: 36.4 % (ref 12.0–46.0)
Lymphs Abs: 2.4 10*3/uL (ref 0.7–4.0)
MCHC: 34.6 g/dL (ref 30.0–36.0)
MCV: 86.6 fl (ref 78.0–100.0)
Monocytes Absolute: 0.7 10*3/uL (ref 0.1–1.0)
Monocytes Relative: 11.2 % (ref 3.0–12.0)
Neutro Abs: 3.3 10*3/uL (ref 1.4–7.7)
Neutrophils Relative %: 48.8 % (ref 43.0–77.0)
Platelets: 202 10*3/uL (ref 150.0–400.0)
RBC: 5.3 Mil/uL (ref 4.22–5.81)
RDW: 13.6 % (ref 11.5–15.5)
WBC: 6.7 10*3/uL (ref 4.0–10.5)

## 2019-12-13 LAB — BASIC METABOLIC PANEL
BUN: 25 mg/dL — ABNORMAL HIGH (ref 6–23)
CO2: 29 mEq/L (ref 19–32)
Calcium: 10.1 mg/dL (ref 8.4–10.5)
Chloride: 101 mEq/L (ref 96–112)
Creatinine, Ser: 1.01 mg/dL (ref 0.40–1.50)
GFR: 74.29 mL/min (ref >60.00)
Glucose, Bld: 142 mg/dL — ABNORMAL HIGH (ref 70–99)
Potassium: 3.9 mEq/L (ref 3.5–5.1)
Sodium: 135 mEq/L (ref 135–145)

## 2019-12-13 LAB — HEPATIC FUNCTION PANEL
ALT: 18 U/L (ref 0–53)
AST: 14 U/L (ref 0–37)
Albumin: 4.5 g/dL (ref 3.5–5.2)
Alkaline Phosphatase: 66 U/L (ref 39–117)
Bilirubin, Direct: 0.1 mg/dL (ref 0.0–0.3)
Total Bilirubin: 0.4 mg/dL (ref 0.2–1.2)
Total Protein: 7.3 g/dL (ref 6.0–8.3)

## 2019-12-13 LAB — POCT GLYCOSYLATED HEMOGLOBIN (HGB A1C): Hemoglobin A1C: 7.6 % — AB (ref 4.0–5.6)

## 2019-12-13 LAB — LDL CHOLESTEROL, DIRECT: Direct LDL: 153 mg/dL

## 2019-12-13 LAB — LIPID PANEL
Cholesterol: 220 mg/dL — ABNORMAL HIGH (ref 0–200)
HDL: 39.6 mg/dL (ref >39.00)
NonHDL: 180.8
Total CHOL/HDL Ratio: 6
Triglycerides: 208 mg/dL — ABNORMAL HIGH (ref 0.0–149.0)
VLDL: 41.6 mg/dL — ABNORMAL HIGH (ref 0.0–40.0)

## 2019-12-13 LAB — MICROALBUMIN / CREATININE URINE RATIO
Creatinine,U: 126.7 mg/dL
Microalb Creat Ratio: 0.9 mg/g (ref 0.0–30.0)
Microalb, Ur: 1.1 mg/dL (ref 0.0–1.9)

## 2019-12-13 NOTE — Progress Notes (Signed)
Subjective:  Patient ID: Randy Rodriguez, male    DOB: 1956-03-05  Age: 64 y.o. MRN: [id number]  CC: Hypertension, Hyperlipidemia, Annual Exam, and Diabetes  This visit occurred during the SARS-CoV-2 public health emergency.  Safety protocols were in place, including screening questions prior to the visit, additional usage of staff PPE, and extensive cleaning of exam room while observing appropriate contact time as indicated for disinfecting solutions.    HPI Randy Rodriguez presents for a CPX.  He complains of a 75-monthhistory of dyspnea on exertion after climbing 1 flights of stairs.  The symptoms have not recently worsened and he denies chest pain, diaphoresis, dizziness, lightheadedness, palpitations, edema, or fatigue.  He has not been measuring his blood pressure or his blood sugar.  He denies headache, blurred vision, polys, or weight changes.  He has a chronic, unchanged degree of lower extremity edema.  Outpatient Medications Prior to Visit  Medication Sig Dispense Refill  . atorvastatin (LIPITOR) 40 MG tablet TAKE 1 TABLET BY MOUTH  DAILY (Patient not taking: Reported on 12/13/2019) 90 tablet 1  . Blood Glucose Monitoring Suppl (ONETOUCH VERIO IQ SYSTEM) W/DEVICE KIT 1 Act by Does not apply route 2 (two) times daily. (Patient not taking: Reported on 12/13/2019) 1 kit 0  . glucose blood test strip Use BID (Patient not taking: Reported on 12/13/2019) 100 each 11  . metFORMIN (GLUCOPHAGE) 1000 MG tablet TAKE 1 TABLET BY MOUTH TWO  TIMES DAILY WITH MEALS (Patient not taking: Reported on 12/13/2019) 180 tablet 2  . omeprazole (PRILOSEC) 20 MG capsule Take 20 mg by mouth daily.   (Patient not taking: Reported on 12/13/2019)     No facility-administered medications prior to visit.    ROS Review of Systems  Constitutional: Negative for chills, diaphoresis, fatigue and fever.  HENT: Negative.   Eyes: Negative for visual disturbance.  Respiratory: Positive for shortness of breath (DOE). Negative for  cough, chest tightness and wheezing.   Cardiovascular: Negative for chest pain, palpitations and leg swelling.  Gastrointestinal: Negative for abdominal pain, constipation, diarrhea, nausea and vomiting.  Endocrine: Negative.   Genitourinary: Negative.  Negative for difficulty urinating, dysuria, hematuria, scrotal swelling and urgency.  Musculoskeletal: Negative.  Negative for arthralgias and myalgias.  Skin: Negative.  Negative for color change and pallor.  Neurological: Negative.  Negative for dizziness, weakness, light-headedness, numbness and headaches.  Hematological: Negative for adenopathy. Does not bruise/bleed easily.  Psychiatric/Behavioral: Negative.     Objective:  BP (!) 144/90 (BP Location: Left Arm, Patient Position: Sitting, Cuff Size: Normal)   Pulse 85   Temp 98.2 F (36.8 C) (Oral)   Resp 16   Ht 6' 2"  (1.88 m)   Wt 238 lb 8 oz (108.2 kg)   SpO2 95%   BMI 30.62 kg/m   BP Readings from Last 3 Encounters:  12/13/19 (!) 144/90  04/05/17 134/80  09/24/16 124/86    Wt Readings from Last 3 Encounters:  12/13/19 238 lb 8 oz (108.2 kg)  04/05/17 241 lb (109.3 kg)  09/24/16 225 lb 1.3 oz (102.1 kg)    Physical Exam Vitals reviewed. Exam conducted with a chaperone present (Everette Rank.  Constitutional:      Appearance: Normal appearance.  HENT:     Nose: Nose normal.     Mouth/Throat:     Mouth: Mucous membranes are moist.     Pharynx: No oropharyngeal exudate.  Eyes:     General: No scleral icterus.    Conjunctiva/sclera: Conjunctivae normal.  Cardiovascular:  Rate and Rhythm: Normal rate and regular rhythm.     Heart sounds: No murmur heard.      Comments: EKG- NSR, 80 bpm Normal EKG Pulmonary:     Effort: Pulmonary effort is normal.     Breath sounds: No stridor. No wheezing, rhonchi or rales.  Abdominal:     General: Abdomen is flat.     Palpations: There is no mass.     Tenderness: There is no abdominal tenderness. There is no guarding.      Hernia: There is no hernia in the left inguinal area or right inguinal area.  Genitourinary:    Pubic Area: No rash.      Penis: Normal and circumcised. No discharge, swelling or lesions.      Testes: Normal.        Right: Mass, tenderness or swelling not present.        Left: Mass, tenderness or swelling not present.     Epididymis:     Right: Normal.     Left: Normal.     Prostate: Normal. Not enlarged, not tender and no nodules present.     Rectum: Normal. Guaiac result negative. No mass, tenderness, anal fissure, external hemorrhoid or internal hemorrhoid. Normal anal tone.  Musculoskeletal:        General: No swelling. Normal range of motion.     Cervical back: Neck supple.     Right lower leg: Edema (trace pitting) present.     Left lower leg: Edema (trace pitting) present.  Lymphadenopathy:     Cervical: No cervical adenopathy.     Lower Body: No right inguinal adenopathy. No left inguinal adenopathy.  Skin:    General: Skin is warm and dry.     Coloration: Skin is not pale.  Neurological:     General: No focal deficit present.     Mental Status: He is alert and oriented to person, place, and time. Mental status is at baseline.  Psychiatric:        Mood and Affect: Mood normal.        Behavior: Behavior normal.     Lab Results  Component Value Date   WBC 6.7 12/13/2019   HGB 15.9 12/13/2019   HCT 45.9 12/13/2019   PLT 202.0 12/13/2019   GLUCOSE 142 (H) 12/13/2019   CHOL 220 (H) 12/13/2019   TRIG 208.0 (H) 12/13/2019   HDL 39.60 12/13/2019   LDLDIRECT 153.0 12/13/2019   LDLCALC 96 04/05/2017   ALT 18 12/13/2019   AST 14 12/13/2019   NA 135 12/13/2019   K 3.9 12/13/2019   CL 101 12/13/2019   CREATININE 1.01 12/13/2019   BUN 25 (H) 12/13/2019   CO2 29 12/13/2019   TSH 1.42 12/13/2019   PSA 1.23 12/13/2019   INR 1.13 01/31/2013   HGBA1C 7.6 (A) 12/13/2019   MICROALBUR 1.1 12/13/2019    No results found.  Assessment & Plan:   Randy Rodriguez was seen today  for hypertension, hyperlipidemia, annual exam and diabetes.  Diagnoses and all orders for this visit:  Routine general medical examination at a health care facility- Exam completed, labs reviewed, vaccines reviewed and updated, cancer screenings are up-to-date, patient education material was given. -     Lipid panel; Future -     PSA; Future  Hyperlipidemia with target LDL less than 100- He has not achieved his LDL goal.  I have asked him to restart a statin for cardiovascular risk factor and to take a baby aspirin each  day. -     TSH; Future -     Hepatic function panel; Future -     atorvastatin (LIPITOR) 40 MG tablet; Take 1 tablet (40 mg total) by mouth daily. -     aspirin EC 81 MG tablet; Take 1 tablet (81 mg total) by mouth daily.  Type II diabetes mellitus with manifestations ([place])- His blood sugars are not adequately well controlled.  I recommended that he start taking Metformin and an SGLT2 inhibitor. -     CBC with Differential/Platelet; Future -     Basic metabolic panel; Future -     Microalbumin / creatinine urine ratio; Future -     Urinalysis, Routine w reflex microscopic; Future -     POCT glycosylated hemoglobin (Hb A1C) -     HM Diabetes Foot Exam -     Ambulatory referral to Ophthalmology -     Discontinue: Blood Glucose Monitoring Suppl (ONETOUCH VERIO IQ SYSTEM) w/Device KIT; 1 Act by Does not apply route 2 (two) times daily. -     Blood Glucose Monitoring Suppl (ONETOUCH VERIO IQ SYSTEM) w/Device KIT; 1 Act by Does not apply route 2 (two) times daily. -     glucose blood test strip; Use BID -     olmesartan (BENICAR) 20 MG tablet; Take 1 tablet (20 mg total) by mouth daily. -     Dapagliflozin-metFORMIN HCl ER (XIGDUO XR) 03-999 MG TB24; Take 1 tablet by mouth daily.  Essential hypertension- His blood pressure is not adequately well controlled.  His EKG is negative for LVH or ischemia.  Labs are negative for secondary causes or endorgan damage.  I have asked him to  start taking an ARB. -     EKG 12-Lead -     olmesartan (BENICAR) 20 MG tablet; Take 1 tablet (20 mg total) by mouth daily.  DOE (dyspnea on exertion)- His symptoms are suspicious for angina equivalent.  I have asked him to undergo a CT cardiac scoring to screen for atherosclerosis. -     EKG 12-Lead -     CT CARDIAC SCORING; Future  Hypertriglyceridemia, essential- I have asked him to start taking icosapent ethyl for cardiovascular risk reduction. -     icosapent Ethyl (VASCEPA) 1 g capsule; Take 2 capsules (2 g total) by mouth 2 (two) times daily.   I have discontinued Adron Bene "Jim"'s omeprazole and metFORMIN. I have also changed his atorvastatin. Additionally, I am having him start on aspirin EC, olmesartan, icosapent Ethyl, and Xigduo XR. Lastly, I am having him maintain his OneTouch Verio IQ System and glucose blood.  Meds ordered this encounter  Medications  . atorvastatin (LIPITOR) 40 MG tablet    Sig: Take 1 tablet (40 mg total) by mouth daily.    Dispense:  90 tablet    Refill:  1  . DISCONTD: Blood Glucose Monitoring Suppl (ONETOUCH VERIO IQ SYSTEM) w/Device KIT    Sig: 1 Act by Does not apply route 2 (two) times daily.    Dispense:  1 kit    Refill:  0  . Blood Glucose Monitoring Suppl (ONETOUCH VERIO IQ SYSTEM) w/Device KIT    Sig: 1 Act by Does not apply route 2 (two) times daily.    Dispense:  1 kit    Refill:  2  . glucose blood test strip    Sig: Use BID    Dispense:  100 each    Refill:  11  . aspirin EC 81 MG  tablet    Sig: Take 1 tablet (81 mg total) by mouth daily.    Dispense:  90 tablet    Refill:  1  . olmesartan (BENICAR) 20 MG tablet    Sig: Take 1 tablet (20 mg total) by mouth daily.    Dispense:  90 tablet    Refill:  1  . icosapent Ethyl (VASCEPA) 1 g capsule    Sig: Take 2 capsules (2 g total) by mouth 2 (two) times daily.    Dispense:  360 capsule    Refill:  1  . Dapagliflozin-metFORMIN HCl ER (XIGDUO XR) 03-999 MG TB24    Sig: Take 1  tablet by mouth daily.    Dispense:  90 tablet    Refill:  1   In addition to time spent on CPE, I spent 50 minutes in preparing to see the patient by review of recent labs, imaging and procedures, obtaining and reviewing separately obtained history, communicating with the patient and family or caregiver, ordering medications, tests or procedures, and documenting clinical information in the EHR including the differential Dx, treatment, and any further evaluation and other management of  1. Hyperlipidemia with target LDL less than 100 2. Type II diabetes mellitus with manifestations (Randy Rodriguez) 3. Essential hypertension 4. DOE (dyspnea on exertion) 5. Hypertriglyceridemia, essential      Follow-up: Return in about 3 months (around 03/14/2020).  Scarlette Calico, MD

## 2019-12-13 NOTE — Patient Instructions (Signed)

## 2019-12-14 LAB — PSA: PSA: 1.23 ng/mL (ref 0.10–4.00)

## 2019-12-14 LAB — TSH: TSH: 1.42 u[IU]/mL (ref 0.35–4.50)

## 2019-12-14 MED ORDER — ATORVASTATIN CALCIUM 40 MG PO TABS: 40.0000 mg | ORAL_TABLET | Freq: Every day | ORAL | 1 refills | Status: DC

## 2019-12-14 MED ORDER — ICOSAPENT ETHYL 1 G PO CAPS: 2.0000 g | ORAL_CAPSULE | Freq: Two times a day (BID) | ORAL | 1 refills | Status: DC

## 2019-12-14 MED ORDER — ONETOUCH VERIO IQ SYSTEM W/DEVICE KIT: 1.0000 | PACK | Freq: Two times a day (BID) | 0 refills | Status: DC

## 2019-12-14 MED ORDER — ASPIRIN EC 81 MG PO TBEC: 81.0000 mg | DELAYED_RELEASE_TABLET | Freq: Every day | ORAL | 1 refills | Status: AC

## 2019-12-14 MED ORDER — OLMESARTAN MEDOXOMIL 20 MG PO TABS: 20.0000 mg | ORAL_TABLET | Freq: Every day | ORAL | 1 refills | Status: DC

## 2019-12-14 MED ORDER — ONETOUCH VERIO IQ SYSTEM W/DEVICE KIT: 1.0000 | PACK | Freq: Two times a day (BID) | 2 refills | Status: AC

## 2019-12-14 MED ORDER — GLUCOSE BLOOD VI STRP: ORAL_STRIP | 11 refills | Status: AC

## 2019-12-14 MED ORDER — XIGDUO XR 10-1000 MG PO TB24: 1.0000 | ORAL_TABLET | Freq: Every day | ORAL | 1 refills | Status: DC

## 2019-12-18 ENCOUNTER — Telehealth: Payer: Self-pay

## 2019-12-18 ENCOUNTER — Other Ambulatory Visit: Payer: Self-pay | Admitting: Internal Medicine

## 2019-12-18 DIAGNOSIS — E118 Type 2 diabetes mellitus with unspecified complications: Secondary | ICD-10-CM

## 2019-12-18 MED ORDER — METFORMIN HCL ER 750 MG PO TB24: 750.0000 mg | ORAL_TABLET | Freq: Every day | ORAL | 1 refills | Status: DC

## 2019-12-18 MED ORDER — DAPAGLIFLOZIN PROPANEDIOL 10 MG PO TABS: 10.0000 mg | ORAL_TABLET | Freq: Every day | ORAL | 1 refills | Status: DC

## 2019-12-18 NOTE — Telephone Encounter (Signed)
New message  Pt c/o medication issue:  1. Name of Medication: Dapagliflozin-metFORMIN HCl ER (XIGDUO XR) 03-999 MG TB24  2. How are you currently taking this medication (dosage and times per day)? Daily   3. Are you having a reaction (difficulty breathing--STAT)? No   4. What is your medication issue? Cost $ 420.00 then recheck $ 1,000  Looking for options

## 2019-12-18 NOTE — Telephone Encounter (Signed)
I look at the formulary for Randy Rodriguez and [name] is not on the formulary

## 2019-12-19 ENCOUNTER — Other Ambulatory Visit: Payer: Self-pay | Admitting: Internal Medicine

## 2019-12-19 DIAGNOSIS — E118 Type 2 diabetes mellitus with unspecified complications: Secondary | ICD-10-CM

## 2019-12-19 MED ORDER — DAPAGLIFLOZIN PROPANEDIOL 10 MG PO TABS: 10.0000 mg | ORAL_TABLET | Freq: Every day | ORAL | 1 refills | Status: DC

## 2019-12-19 MED ORDER — METFORMIN HCL ER 750 MG PO TB24: 750.0000 mg | ORAL_TABLET | Freq: Every day | ORAL | 1 refills | Status: DC

## 2019-12-22 ENCOUNTER — Inpatient Hospital Stay: Admission: RE | Admit: 2019-12-22 | Payer: PRIVATE HEALTH INSURANCE | Source: Ambulatory Visit

## 2019-12-22 ENCOUNTER — Telehealth: Payer: Self-pay

## 2019-12-22 NOTE — Telephone Encounter (Signed)
PA was denied.   It did not give me any reasons it was denied.

## 2019-12-22 NOTE — Telephone Encounter (Signed)
Key: [id number]

## 2019-12-23 ENCOUNTER — Other Ambulatory Visit: Payer: Self-pay | Admitting: Internal Medicine

## 2020-01-01 ENCOUNTER — Ambulatory Visit (INDEPENDENT_AMBULATORY_CARE_PROVIDER_SITE_OTHER)
Admission: RE | Admit: 2020-01-01 | Discharge: 2020-01-01 | Disposition: A | Discharge status: Home / Self Care | Payer: Self-pay | Source: Ambulatory Visit | Attending: Internal Medicine | Admitting: Internal Medicine

## 2020-01-01 ENCOUNTER — Other Ambulatory Visit: Payer: Self-pay

## 2020-01-01 DIAGNOSIS — R0600 Dyspnea, unspecified: Secondary | ICD-10-CM

## 2020-01-01 DIAGNOSIS — R0609 Other forms of dyspnea: Secondary | ICD-10-CM

## 2020-01-02 ENCOUNTER — Other Ambulatory Visit: Payer: Self-pay | Admitting: Internal Medicine

## 2020-01-02 DIAGNOSIS — R0609 Other forms of dyspnea: Secondary | ICD-10-CM

## 2020-01-02 DIAGNOSIS — R9431 Abnormal electrocardiogram [ECG] [EKG]: Secondary | ICD-10-CM

## 2020-01-04 ENCOUNTER — Telehealth (HOSPITAL_COMMUNITY): Payer: Self-pay

## 2020-01-04 NOTE — Telephone Encounter (Signed)
Encounter complete. 

## 2020-01-05 ENCOUNTER — Ambulatory Visit (HOSPITAL_COMMUNITY)
Admission: RE | Admit: 2020-01-05 | Discharge: 2020-01-05 | Disposition: A | Discharge status: Home / Self Care | Payer: PRIVATE HEALTH INSURANCE | Source: Ambulatory Visit | Attending: Cardiology | Admitting: Cardiology

## 2020-01-05 ENCOUNTER — Other Ambulatory Visit: Payer: Self-pay

## 2020-01-05 DIAGNOSIS — R9431 Abnormal electrocardiogram [ECG] [EKG]: Secondary | ICD-10-CM | POA: Diagnosis not present

## 2020-01-05 DIAGNOSIS — R0600 Dyspnea, unspecified: Secondary | ICD-10-CM | POA: Diagnosis not present

## 2020-01-05 DIAGNOSIS — R0609 Other forms of dyspnea: Secondary | ICD-10-CM

## 2020-01-05 LAB — MYOCARDIAL PERFUSION IMAGING
LV dias vol: 107 mL (ref 62–150)
LV sys vol: 51 mL
Peak HR: 99 {beats}/min
Rest HR: 65 {beats}/min
SDS: 2
SRS: 1
SSS: 3
TID: 1.15

## 2020-01-05 MED ORDER — TECHNETIUM TC 99M TETROFOSMIN IV KIT
9.4000 | PACK | Freq: Once | INTRAVENOUS | Status: AC | PRN
  Administered 2020-01-05: 9.4 via INTRAVENOUS
  Filled 2020-01-05: qty 10

## 2020-01-05 MED ORDER — REGADENOSON 0.4 MG/5ML IV SOLN
0.4000 mg | Freq: Once | INTRAVENOUS | Status: AC
Start: 2020-01-05 — End: 2020-01-05
  Administered 2020-01-05: 0.4 mg via INTRAVENOUS

## 2020-01-05 MED ORDER — TECHNETIUM TC 99M TETROFOSMIN IV KIT
31.6000 | PACK | Freq: Once | INTRAVENOUS | Status: AC | PRN
  Administered 2020-01-05: 31.6 via INTRAVENOUS
  Filled 2020-01-05: qty 32

## 2020-01-24 NOTE — Telephone Encounter (Signed)
PCP changed the xigduo to metformin.

## 2020-02-08 ENCOUNTER — Encounter: Payer: Self-pay | Admitting: Internal Medicine

## 2020-04-01 ENCOUNTER — Encounter: Payer: Self-pay | Admitting: Internal Medicine

## 2020-04-01 ENCOUNTER — Other Ambulatory Visit: Payer: Self-pay

## 2020-04-01 ENCOUNTER — Ambulatory Visit (INDEPENDENT_AMBULATORY_CARE_PROVIDER_SITE_OTHER): Payer: PRIVATE HEALTH INSURANCE | Admitting: Internal Medicine

## 2020-04-01 VITALS — BP 128/86 | HR 88 | Temp 97.8°F | Resp 16 | Ht 74.0 in | Wt 236.0 lb

## 2020-04-01 DIAGNOSIS — R0600 Dyspnea, unspecified: Secondary | ICD-10-CM | POA: Diagnosis not present

## 2020-04-01 DIAGNOSIS — I10 Essential (primary) hypertension: Secondary | ICD-10-CM | POA: Diagnosis not present

## 2020-04-01 DIAGNOSIS — R0609 Other forms of dyspnea: Secondary | ICD-10-CM

## 2020-04-01 DIAGNOSIS — N1831 Chronic kidney disease, stage 3a: Secondary | ICD-10-CM

## 2020-04-01 DIAGNOSIS — Z23 Encounter for immunization: Secondary | ICD-10-CM

## 2020-04-01 DIAGNOSIS — I25110 Atherosclerotic heart disease of native coronary artery with unstable angina pectoris: Secondary | ICD-10-CM

## 2020-04-01 DIAGNOSIS — E118 Type 2 diabetes mellitus with unspecified complications: Secondary | ICD-10-CM

## 2020-04-01 LAB — BASIC METABOLIC PANEL
BUN: 24 mg/dL — ABNORMAL HIGH (ref 6–23)
CO2: 27 mEq/L (ref 19–32)
Calcium: 9.8 mg/dL (ref 8.4–10.5)
Chloride: 102 mEq/L (ref 96–112)
Creatinine, Ser: 1.63 mg/dL — ABNORMAL HIGH (ref 0.40–1.50)
GFR: 43.67 mL/min — ABNORMAL LOW (ref >60.00)
Glucose, Bld: 121 mg/dL — ABNORMAL HIGH (ref 70–99)
Potassium: 3.8 mEq/L (ref 3.5–5.1)
Sodium: 137 mEq/L (ref 135–145)

## 2020-04-01 LAB — HEMOGLOBIN A1C: Hgb A1c MFr Bld: 7.7 % — ABNORMAL HIGH (ref 4.6–6.5)

## 2020-04-01 MED ORDER — RYBELSUS 3 MG PO TABS: 1.0000 | ORAL_TABLET | Freq: Every day | ORAL | 0 refills | Status: DC

## 2020-04-01 MED ORDER — METFORMIN HCL ER 750 MG PO TB24: 1500.0000 mg | ORAL_TABLET | Freq: Every day | ORAL | 1 refills | Status: DC

## 2020-04-01 NOTE — Patient Instructions (Signed)
Type 2 Diabetes Mellitus, Diagnosis, Adult Type 2 diabetes (type 2 diabetes mellitus) is a long-term (chronic) disease. In type 2 diabetes, one or both of these problems may be present:  The pancreas does not make enough of a hormone called insulin.  Cells in the body do not respond properly to insulin that the body makes (insulin resistance). Normally, insulin allows blood sugar (glucose) to enter cells in the body. The cells use glucose for energy. Insulin resistance or lack of insulin causes excess glucose to build up in the blood instead of going into cells. As a result, high blood glucose (hyperglycemia) develops. What increases the risk? The following factors may make you more likely to develop type 2 diabetes:  Having a family member with type 2 diabetes.  Being overweight or obese.  Having an inactive (sedentary) lifestyle.  Having been diagnosed with insulin resistance.  Having a history of prediabetes, gestational diabetes, or polycystic ovary syndrome (PCOS).  Being of American-Indian, African-American, Hispanic/Latino, or Asian/Pacific Islander descent. What are the signs or symptoms? In the early stage of this condition, you may not have symptoms. Symptoms develop slowly and may include:  Increased thirst (polydipsia).  Increased hunger(polyphagia).  Increased urination (polyuria).  Increased urination during the night (nocturia).  Unexplained weight loss.  Frequent infections that keep coming back (recurring).  Fatigue.  Weakness.  Vision changes, such as blurry vision.  Cuts or bruises that are slow to heal.  Tingling or numbness in the hands or feet.  Dark patches on the skin (acanthosis nigricans). How is this diagnosed? This condition is diagnosed based on your symptoms, your medical history, a physical exam, and your blood glucose level. Your blood glucose may be checked with one or more of the following blood tests:  A fasting blood glucose (FBG)  test. You will not be allowed to eat (you will fast) for 8 hours or longer before a blood sample is taken.  A random blood glucose test. This test checks blood glucose at any time of day regardless of when you ate.  An A1c (hemoglobin A1c) blood test. This test provides information about blood glucose control over the previous 2-3 months.  An oral glucose tolerance test (OGTT). This test measures your blood glucose at two times: ? After fasting. This is your baseline blood glucose level. ? Two hours after drinking a beverage that contains glucose. You may be diagnosed with type 2 diabetes if:  Your FBG level is 126 mg/dL (7.0 mmol/L) or higher.  Your random blood glucose level is 200 mg/dL (11.1 mmol/L) or higher.  Your A1c level is 6.5% or higher.  Your OGTT result is higher than 200 mg/dL (11.1 mmol/L). These blood tests may be repeated to confirm your diagnosis. How is this treated? Your treatment may be managed by a specialist called an endocrinologist. Type 2 diabetes may be treated by following instructions from your health care provider about:  Making diet and lifestyle changes. This may include: ? Following an individualized nutrition plan that is developed by a diet and nutrition specialist (registered dietitian). ? Exercising regularly. ? Finding ways to manage stress.  Checking your blood glucose level as often as told.  Taking diabetes medicines or insulin daily. This helps to keep your blood glucose levels in the healthy range. ? If you use insulin, you may need to adjust the dosage depending on how physically active you are and what foods you eat. Your health care provider will tell you how to adjust your dosage.    Taking medicines to help prevent complications from diabetes, such as: ? Aspirin. ? Medicine to lower cholesterol. ? Medicine to control blood pressure. Your health care provider will set individualized treatment goals for you. Your goals will be based on  your age, other medical conditions you have, and how you respond to diabetes treatment. Generally, the goal of treatment is to maintain the following blood glucose levels:  Before meals (preprandial): 80-130 mg/dL (4.4-7.2 mmol/L).  After meals (postprandial): below 180 mg/dL (10 mmol/L).  A1c level: less than 7%. Follow these instructions at home: Questions to ask your health care provider  Consider asking the following questions: ? Do I need to meet with a diabetes educator? ? Where can I find a support group for people with diabetes? ? What equipment will I need to manage my diabetes at home? ? What diabetes medicines do I need, and when should I take them? ? How often do I need to check my blood glucose? ? What number can I call if I have questions? ? When is my next appointment? General instructions  Take over-the-counter and prescription medicines only as told by your health care provider.  Keep all follow-up visits as told by your health care provider. This is important.  For more information about diabetes, visit: ? American Diabetes Association (ADA): www.diabetes.org ? American Association of Diabetes Educators (AADE): www.diabeteseducator.org Contact a health care provider if:  Your blood glucose is at or above 240 mg/dL (13.3 mmol/L) for 2 days in a row.  You have been sick or have had a fever for 2 days or longer, and you are not getting better.  You have any of the following problems for more than 6 hours: ? You cannot eat or drink. ? You have nausea and vomiting. ? You have diarrhea. Get help right away if:  Your blood glucose is lower than 54 mg/dL (3.0 mmol/L).  You become confused or you have trouble thinking clearly.  You have difficulty breathing.  You have moderate or large ketone levels in your urine. Summary  Type 2 diabetes (type 2 diabetes mellitus) is a long-term (chronic) disease. In type 2 diabetes, the pancreas does not make enough of a  hormone called insulin, or cells in the body do not respond properly to insulin that the body makes (insulin resistance).  This condition is treated by making diet and lifestyle changes and taking diabetes medicines or insulin.  Your health care provider will set individualized treatment goals for you. Your goals will be based on your age, other medical conditions you have, and how you respond to diabetes treatment.  Keep all follow-up visits as told by your health care provider. This is important. This information is not intended to replace advice given to you by your health care provider. Make sure you discuss any questions you have with your health care provider. Document Revised: 08/06/2017 Document Reviewed: 07/12/2015 Elsevier Patient Education  2020 [employer]  

## 2020-04-01 NOTE — Progress Notes (Signed)
Subjective:  Patient ID: Randy Rodriguez, male    DOB: [birth year]  Age: 64 y.o. MRN: [id number]  CC: Hypertension and Diabetes  This visit occurred during the SARS-CoV-2 public health emergency.  Safety protocols were in place, including screening questions prior to the visit, additional usage of staff PPE, and extensive cleaning of exam room while observing appropriate contact time as indicated for disinfecting solutions.    HPI Randy Rodriguez presents for f/up - He tells me that he can use a push mower for about 3 hours but he has to take breaks because of fatigue and shortness of breath.  He underwent a myocardial perfusion imaging earlier this year that was low risk but it was not normal.  He would like to see a cardiologist to make sure his heart is okay.  He thinks that his blood pressure and blood sugars have been well controlled.  He denies polys.  Outpatient Medications Prior to Visit  Medication Sig Dispense Refill  . aspirin EC 81 MG tablet Take 1 tablet (81 mg total) by mouth daily. 90 tablet 1  . atorvastatin (LIPITOR) 40 MG tablet Take 1 tablet (40 mg total) by mouth daily. 90 tablet 1  . Blood Glucose Monitoring Suppl (ONETOUCH VERIO IQ SYSTEM) w/Device KIT 1 Act by Does not apply route 2 (two) times daily. 1 kit 2  . glucose blood test strip Use BID 100 each 11  . olmesartan (BENICAR) 20 MG tablet Take 1 tablet (20 mg total) by mouth daily. 90 tablet 1  . metFORMIN (GLUCOPHAGE XR) 750 MG 24 hr tablet Take 1 tablet (750 mg total) by mouth daily with breakfast. 90 tablet 1  . dapagliflozin propanediol (FARXIGA) 10 MG TABS tablet Take 1 tablet (10 mg total) by mouth daily before breakfast. 90 tablet 1   No facility-administered medications prior to visit.    ROS Review of Systems  Constitutional: Positive for fatigue. Negative for appetite change, chills and diaphoresis.  HENT: Negative.   Eyes: Negative for visual disturbance.  Respiratory: Positive for shortness of breath (DOE).  Negative for cough, chest tightness and wheezing.   Cardiovascular: Negative for chest pain, palpitations and leg swelling.  Gastrointestinal: Negative for abdominal pain, constipation, diarrhea, nausea and vomiting.  Endocrine: Negative.  Negative for polydipsia, polyphagia and polyuria.  Genitourinary: Negative.  Negative for difficulty urinating and dysuria.  Musculoskeletal: Negative.  Negative for arthralgias and myalgias.  Skin: Negative.  Negative for color change and pallor.  Neurological: Negative for dizziness, weakness, light-headedness and headaches.  Hematological: Negative for adenopathy. Does not bruise/bleed easily.  Psychiatric/Behavioral: Negative.     Objective:  BP 128/86   Pulse 88   Temp 97.8 F (36.6 C) (Oral)   Resp 16   Ht 6' 2"  (1.88 m)   Wt 236 lb (107 kg)   SpO2 95%   BMI 30.30 kg/m   BP Readings from Last 3 Encounters:  04/01/20 128/86  12/13/19 (!) 144/90  04/05/17 134/80    Wt Readings from Last 3 Encounters:  04/01/20 236 lb (107 kg)  01/05/20 238 lb (108 kg)  12/13/19 238 lb 8 oz (108.2 kg)    Physical Exam Vitals reviewed.  HENT:     Nose: Nose normal.     Mouth/Throat:     Mouth: Mucous membranes are moist.  Eyes:     General: No scleral icterus.    Conjunctiva/sclera: Conjunctivae normal.  Cardiovascular:     Rate and Rhythm: Normal rate and regular rhythm.  Heart sounds: No murmur heard.   Pulmonary:     Effort: Pulmonary effort is normal.     Breath sounds: No stridor. No wheezing, rhonchi or rales.  Abdominal:     General: Abdomen is flat.     Palpations: There is no mass.     Tenderness: There is no abdominal tenderness. There is no guarding.  Musculoskeletal:        General: Normal range of motion.     Cervical back: Neck supple.     Right lower leg: No edema.     Left lower leg: No edema.  Lymphadenopathy:     Cervical: No cervical adenopathy.  Skin:    General: Skin is warm and dry.     Coloration: Skin is  not pale.  Neurological:     General: No focal deficit present.     Mental Status: He is alert.  Psychiatric:        Mood and Affect: Mood normal.        Behavior: Behavior normal.     Lab Results  Component Value Date   WBC 6.7 12/13/2019   HGB 15.9 12/13/2019   HCT 45.9 12/13/2019   PLT 202.0 12/13/2019   GLUCOSE 121 (H) 04/01/2020   CHOL 220 (H) 12/13/2019   TRIG 208.0 (H) 12/13/2019   HDL 39.60 12/13/2019   LDLDIRECT 153.0 12/13/2019   LDLCALC 96 04/05/2017   ALT 18 12/13/2019   AST 14 12/13/2019   NA 137 04/01/2020   K 3.8 04/01/2020   CL 102 04/01/2020   CREATININE 1.63 (H) 04/01/2020   BUN 24 (H) 04/01/2020   CO2 27 04/01/2020   TSH 1.42 12/13/2019   PSA 1.23 12/13/2019   INR 1.13 01/31/2013   HGBA1C 7.7 (H) 04/01/2020   MICROALBUR 1.1 12/13/2019    MYOCARDIAL PERFUSION IMAGING  Result Date: 01/05/2020  Nuclear stress EF: 52%.  The left ventricular ejection fraction is mildly decreased (45-54%).  No T wave inversion was noted during stress.  There was no ST segment deviation noted during stress.  Defect 1: There is a small defect of mild severity present in the apical septal and apex location.  Small size, mild intensity partially reversible apical/apical septal perfusion defect, may be artifact or possibly small scar with mild peri-infarct ischemia. No significant reversible ischemia. LVEF 52% with normal wall motion. This is a low risk study.    Assessment & Plan:   Randy Rodriguez was seen today for hypertension and diabetes.  Diagnoses and all orders for this visit:  Essential hypertension- His blood pressure is adequately well controlled.  His electrolytes are normal.  There has been a slight decline in his renal function. -     Cancel: BASIC METABOLIC PANEL WITH GFR; Future -     Basic metabolic panel; Future -     Basic metabolic panel  Type II diabetes mellitus with manifestations ([place])- His A1c is at 7.7%.  His blood sugars are not adequately well  controlled.  I recommended that he increase the dose of Metformin and to add an oral GLP-1 agonist. -     Ambulatory referral to Ophthalmology -     Cancel: BASIC METABOLIC PANEL WITH GFR; Future -     Hemoglobin A1c; Future -     Basic metabolic panel; Future -     Basic metabolic panel -     Hemoglobin A1c -     metFORMIN (GLUCOPHAGE XR) 750 MG 24 hr tablet; Take 2 tablets (1,500 mg  total) by mouth daily with breakfast. -     Semaglutide (RYBELSUS) 3 MG TABS; Take 1 tablet by mouth daily.  DOE (dyspnea on exertion) -     Ambulatory referral to Cardiology  Stage 3a chronic kidney disease ([place])- Will try to get better control of his blood sugar.  His blood pressure is well controlled.  He was advised to avoid anti-inflammatories and nephrotoxic agents.  Other orders -     Pneumococcal conjugate vaccine 13-valent   I have discontinued Randy Bene "Jim"'s dapagliflozin propanediol. I have also changed his metFORMIN. Additionally, I am having him start on Rybelsus. Lastly, I am having him maintain his atorvastatin, OneTouch Verio IQ System, glucose blood, aspirin EC, and olmesartan.  Meds ordered this encounter  Medications  . metFORMIN (GLUCOPHAGE XR) 750 MG 24 hr tablet    Sig: Take 2 tablets (1,500 mg total) by mouth daily with breakfast.    Dispense:  180 tablet    Refill:  1  . Semaglutide (RYBELSUS) 3 MG TABS    Sig: Take 1 tablet by mouth daily.    Dispense:  30 tablet    Refill:  0     Follow-up: Return in about 6 months (around 09/30/2020).  Scarlette Calico, MD

## 2020-04-02 ENCOUNTER — Encounter: Payer: Self-pay | Admitting: Internal Medicine

## 2020-04-02 DIAGNOSIS — N1831 Chronic kidney disease, stage 3a: Secondary | ICD-10-CM | POA: Insufficient documentation

## 2020-04-23 NOTE — Progress Notes (Signed)
Cardiology Office Note:    Date:  04/24/2020   ID:  Randy Rodriguez, DOB 10-26-1955, MRN [id number]  PCP:  [name], MD  [place] Cardiologist:  No primary care provider on file.  [facility] Electrophysiologist:  None   Referring MD: [name], MD     History of Present Illness:    Randy Rodriguez is a 64 y.o. male with a hx of DMII, HLD, BPH, and GERD who was referred by Dr. Ronnald Ramp for evaluation of decreased exercise capacity and SOB with exertion.  Patient was recently seen by Dr. Ronnald Ramp where he states that he used to be able to push a lawn mower for about 3 hours but now has to stop to take breaks due to fatigue and SOB. Myoview was performed in July which noted a small partially reversible apical/apical septal defect (artifact vs scar with possible peri-infarct ischemia). EF on myoview 52%. Coronary calcium score 149 (64% for age-sex matched controls).  Today, the patient states that he gets more short of breath when walking up the stairs. Has been worsening over the past 1.5 years. No chest pressure or pain when this occurs. Has some associated lightheadedness but no syncope. No palpitations. Has some pedal edema. No orthopnea, or PND.   Family history: Father-CHF at 65; Mom: HTN and CVA; Sister-healthy.  Past Medical History:  Diagnosis Date  . BPH (benign prostatic hyperplasia) 2012   biopsy this week with ? MD at [facility]  . Diabetes mellitus   . GERD (gastroesophageal reflux disease)   . Hyperlipidemia     Past Surgical History:  Procedure Laterality Date  . COLONOSCOPY    . POLYPECTOMY    . TONSILLECTOMY  1964  . UPPER GASTROINTESTINAL ENDOSCOPY      Current Medications: Current Meds  Medication Sig  . aspirin EC 81 MG tablet Take 1 tablet (81 mg total) by mouth daily.  Marland Kitchen atorvastatin (LIPITOR) 40 MG tablet Take 1 tablet (40 mg total) by mouth daily.  . Blood Glucose Monitoring Suppl (ONETOUCH VERIO IQ SYSTEM) w/Device KIT 1 Act by Does not  apply route 2 (two) times daily.  Marland Kitchen glucose blood test strip Use BID  . metFORMIN (GLUCOPHAGE XR) 750 MG 24 hr tablet Take 2 tablets (1,500 mg total) by mouth daily with breakfast.  . olmesartan (BENICAR) 20 MG tablet Take 1 tablet (20 mg total) by mouth daily.  . Semaglutide (RYBELSUS) 3 MG TABS Take 1 tablet by mouth daily.     Allergies:   Patient has no known allergies.   Social History   Socioeconomic History  . Marital status: Married    Spouse name: Not on file  . Number of children: Not on file  . Years of education: Not on file  . Highest education level: Not on file  Occupational History  . Not on file  Tobacco Use  . Smoking status: Never Smoker  . Smokeless tobacco: Never Used  Substance and Sexual Activity  . Alcohol use: Yes    Alcohol/week: 2.0 standard drinks    Types: 2 Cans of beer per week  . Drug use: No  . Sexual activity: Yes  Other Topics Concern  . Not on file  Social History Narrative  . Not on file   Social Determinants of Health   Financial Resource Strain:   . Difficulty of Paying Living Expenses: Not on file  Food Insecurity:   . Worried About Charity fundraiser in the Last Year: Not  on file  . Ran Out of Food in the Last Year: Not on file  Transportation Needs:   . Lack of Transportation (Medical): Not on file  . Lack of Transportation (Non-Medical): Not on file  Physical Activity:   . Days of Exercise per Week: Not on file  . Minutes of Exercise per Session: Not on file  Stress:   . Feeling of Stress : Not on file  Social Connections:   . Frequency of Communication with Friends and Family: Not on file  . Frequency of Social Gatherings with Friends and Family: Not on file  . Attends Religious Services: Not on file  . Active Member of Clubs or Organizations: Not on file  . Attends Archivist Meetings: Not on file  . Marital Status: Not on file     Family History: The patient's family history includes Hypertension in his  mother; Prostate cancer in his father; Stomach cancer in his maternal grandfather; Stroke in his mother. There is no history of Colon cancer, Esophageal cancer, Cancer, Alcohol abuse, Diabetes, Drug abuse, Early death, Heart disease, Hyperlipidemia, Kidney disease, Colon polyps, or Rectal cancer.  ROS:   Please see the history of present illness.    Review of Systems  Constitutional: Negative for chills, fever and weight loss.  HENT: Negative for hearing loss.   Eyes: Negative for blurred vision.  Respiratory: Positive for shortness of breath.   Cardiovascular: Negative for chest pain, palpitations, orthopnea, claudication, leg swelling and PND.  Gastrointestinal: Negative for blood in stool, nausea and vomiting.  Genitourinary: Negative for hematuria.  Musculoskeletal: Positive for back pain.  Neurological: Positive for dizziness. Negative for loss of consciousness.  Psychiatric/Behavioral: Negative for substance abuse.    EKGs/Labs/Other Studies Reviewed:    The following studies were reviewed today: Myoview 12/2019: Result Date: 01/05/2020  Nuclear stress EF: 52%.  The left ventricular ejection fraction is mildly decreased (45-54%).  No T wave inversion was noted during stress.  There was no ST segment deviation noted during stress.  Defect 1: There is a small defect of mild severity present in the apical septal and apex location.  Small size, mild intensity partially reversible apical/apical septal perfusion defect, may be artifact or possibly small scar with mild peri-infarct ischemia. No significant reversible ischemia. LVEF 52% with normal wall motion. This is a low risk study.  FINDINGS: Non-cardiac: See separate report from [facility].  Ascending Aorta: Normal caliber.  Pericardium: Normal.  Coronary arteries: Normal origins.  IMPRESSION: Coronary calcium score of 149. This was 64th percentile for age and sex matched controls.  EKG:  EKG is ordered  today.  The ekg ordered today demonstrates NSR with q waves in III and aVF. Similar to ECG on 06//2021.  Recent Labs: 12/13/2019: ALT 18; Hemoglobin 15.9; Platelets 202.0; TSH 1.42 04/01/2020: BUN 24; Creatinine, Ser 1.63; Potassium 3.8; Sodium 137  Recent Lipid Panel    Component Value Date/Time   CHOL 220 (H) 12/13/2019 1629   TRIG 208.0 (H) 12/13/2019 1629   HDL 39.60 12/13/2019 1629   CHOLHDL 6 12/13/2019 1629   VLDL 41.6 (H) 12/13/2019 1629   LDLCALC 96 04/05/2017 1706   LDLDIRECT 153.0 12/13/2019 1629      Physical Exam:    VS:  BP 106/70   Pulse 82   Ht _0  (1.88 m)   Wt 234 lb (106.1 kg)   SpO2 95%   BMI 30.04 kg/m     Wt Readings from Last 3 Encounters:  04/24/20  234 lb (106.1 kg)  04/01/20 236 lb (107 kg)  01/05/20 238 lb (108 kg)     GEN:  Well nourished, well developed in no acute distress HEENT: Normal NECK: No JVD; No carotid bruits CARDIAC: RRR, no murmurs, rubs, gallops RESPIRATORY:  Clear to auscultation without rales, wheezing or rhonchi  ABDOMEN: Soft, non-tender, non-distended MUSCULOSKELETAL:  No edema; No deformity  SKIN: Warm and dry NEUROLOGIC:  Alert and oriented x 3 PSYCHIATRIC:  Normal affect   ASSESSMENT:    1. DOE (dyspnea on exertion)   2. Chest pain of uncertain etiology   3. Mixed hyperlipidemia   4. Type 2 diabetes mellitus without complication, without long-term current use of insulin (HCC)    PLAN:    In order of problems listed above:  #SOB with exertion #Coronary Calcification: Patient with progressive SOB on exertion over the past 1.5 years. Myoview with possible apical scar vs artifact with ? Peri-infarct ischemia. EF 52%. Coronary calcium score 162. Given progressive nature of symptoms and decreased exercise capacity, concern for underlying angina.  -Plan for coronary CTA if renal function returns normal on repeat labs -Check TTE -Repeat BMET today to ensure renal function normal -Continue ASA 56m daily and  atorvastatin 42mdaily  #HLD: #Elevated TG Recently resumed atorvastatin after LDL 153. -Continue atorvastatin 402maily -Goal LDL <70 given CAD on CT chest -Continue vascepa -Follow-up with PCP  #DMIII: A1C 7.7. -Follow-up with PCP as scheduled -Continue metformin, statin, semaglutide and omesartan per PCP  #Elevated Cr: Noted on last lab check to be 1.6 from 1.0 in 11/2019. Patient is unaware of any renal disease. -Repeat BMET today to ensure Cr normalized  Medication Adjustments/Labs and Tests Ordered: Current medicines are reviewed at length with the patient today.  Concerns regarding medicines are outlined above.  Orders Placed This Encounter  Procedures  . CT CORONARY MORPH W/CTA COR W/SCORE W/CA W/CM &/OR WO/CM  . CT CORONARY FRACTIONAL FLOW RESERVE DATA PREP  . CT CORONARY FRACTIONAL FLOW RESERVE FLUID ANALYSIS  . Basic metabolic panel  . EKG 12-Lead  . ECHOCARDIOGRAM COMPLETE   Meds ordered this encounter  Medications  . metoprolol tartrate (LOPRESSOR) 100 MG tablet    Sig: Take one tablet by mouth 2 hours prior to CT    Dispense:  1 tablet    Refill:  0    Patient Instructions  Medication Instructions:  *If you need a refill on your cardiac medications before your next appointment, please call your pharmacy*  Lab Work: Your physician recommends that you have lab work today- BMET.  If you have labs (blood work) drawn today and your tests are completely normal, you will receive your results only by: . MMarland KitchenChart Message (if you have MyChart) OR . A paper copy in the mail If you have any lab test that is abnormal or we need to change your treatment, we will call you to review the results.  Testing/Procedures: Your physician has requested that you have cardiac CT. Cardiac computed tomography (CT) is a painless test that uses an x-ray machine to take clear, detailed pictures of your heart. For further information please visit [email or web address] follow  instruction sheet as given.  Your physician has requested that you have an echocardiogram. Echocardiography is a painless test that uses sound waves to create images of your heart. It provides your doctor with information about the size and shape of your heart and how well your heart's chambers and valves are working. This procedure takes approximately  one hour. There are no restrictions for this procedure.  Follow-Up: At [facility], you and your health needs are our priority.  As part of our continuing mission to provide you with exceptional heart care, we have created designated Provider Care Teams.  These Care Teams include your primary Cardiologist (physician) and Advanced Practice Providers (APPs -  Physician Assistants and Nurse Practitioners) who all work together to provide you with the care you need, when you need it.  We recommend signing up for the patient portal called "MyChart".  Sign up information is provided on this After Visit Summary.  MyChart is used to connect with patients for Virtual Visits (Telemedicine).  Patients are able to view lab/test results, encounter notes, upcoming appointments, etc.  Non-urgent messages can be sent to your provider as well.   To learn more about what you can do with MyChart, go to [email or web address].    Your next appointment:   3 month(s)  The format for your next appointment:   In Person  Provider:   You may see Dr. Johney Frame or one of the following Advanced Practice Providers on your designated Care Team:    Richardson Dopp, PA-C  Vin Driggs, [place]    Your cardiac CT will be scheduled at one of the below locations:   [facility] [address] [place], [place] [address] [phone]  If scheduled at [facility], please arrive at the [facility] main entrance of [facility] 30 minutes prior to test start time. Proceed to the [facility] Radiology Department (first floor) to check-in and test  prep.   Please follow these instructions carefully (unless otherwise directed):  Hold all erectile dysfunction medications at least 3 days (72 hrs) prior to test.  On the Night Before the Test: . Be sure to Drink plenty of water. . Do not consume any caffeinated/decaffeinated beverages or chocolate 12 hours prior to your test. . Do not take any antihistamines 12 hours prior to your test.  On the Day of the Test: . Drink plenty of water. Do not drink any water within one hour of the test. . Do not eat any food 4 hours prior to the test. . You may take your regular medications prior to the test.  . Take metoprolol (Lopressor) 100 mg two hours prior to test.      After the Test: . Drink plenty of water. . After receiving IV contrast, you may experience a mild flushed feeling. This is normal. . On occasion, you may experience a mild rash up to 24 hours after the test. This is not dangerous. If this occurs, you can take Benadryl 25 mg and increase your fluid intake. . If you experience trouble breathing, this can be serious. If it is severe call 911 IMMEDIATELY. If it is mild, please call our office. . If you take any of these medications: Metformin please do not take 48 hours after completing test unless otherwise instructed.   Once we have confirmed authorization from your insurance company, we will call you to set up a date and time for your test. Based on how quickly your insurance processes prior authorizations requests, please allow up to 4 weeks to be contacted for scheduling your Cardiac CT appointment. Be advised that routine Cardiac CT appointments could be scheduled as many as 8 weeks after your provider has ordered it.  For non-scheduling related questions, please contact the cardiac imaging nurse navigator should you have any questions/concerns: Marchia Bond, Cardiac  Imaging Nurse Navigator Burley Saver, Interim Cardiac Imaging Nurse Navigator  Heart and Vascular  Services Direct Office Dial: [phone]   For scheduling needs, including cancellations and rescheduling, please call Vivien Rota at [phone], option 3.       Signed, [name], MD  04/24/2020 4:12 PM    [facility]

## 2020-04-23 NOTE — H&P (View-Only) (Signed)
Cardiology Office Note:    Date:  04/24/2020   ID:  Randy Rodriguez, DOB 10-26-1955, MRN [id number]  PCP:  [name], MD  [place] Cardiologist:  No primary care provider on file.  [facility] Electrophysiologist:  None   Referring MD: [name], MD     History of Present Illness:    Randy Rodriguez is a 64 y.o. male with a hx of DMII, HLD, BPH, and GERD who was referred by Dr. Ronnald Ramp for evaluation of decreased exercise capacity and SOB with exertion.  Patient was recently seen by Dr. Ronnald Ramp where he states that he used to be able to push a lawn mower for about 3 hours but now has to stop to take breaks due to fatigue and SOB. Myoview was performed in July which noted a small partially reversible apical/apical septal defect (artifact vs scar with possible peri-infarct ischemia). EF on myoview 52%. Coronary calcium score 149 (64% for age-sex matched controls).  Today, the patient states that he gets more short of breath when walking up the stairs. Has been worsening over the past 1.5 years. No chest pressure or pain when this occurs. Has some associated lightheadedness but no syncope. No palpitations. Has some pedal edema. No orthopnea, or PND.   Family history: Father-CHF at 65; Mom: HTN and CVA; Sister-healthy.  Past Medical History:  Diagnosis Date  . BPH (benign prostatic hyperplasia) 2012   biopsy this week with ? MD at [facility]  . Diabetes mellitus   . GERD (gastroesophageal reflux disease)   . Hyperlipidemia     Past Surgical History:  Procedure Laterality Date  . COLONOSCOPY    . POLYPECTOMY    . TONSILLECTOMY  1964  . UPPER GASTROINTESTINAL ENDOSCOPY      Current Medications: Current Meds  Medication Sig  . aspirin EC 81 MG tablet Take 1 tablet (81 mg total) by mouth daily.  Marland Kitchen atorvastatin (LIPITOR) 40 MG tablet Take 1 tablet (40 mg total) by mouth daily.  . Blood Glucose Monitoring Suppl (ONETOUCH VERIO IQ SYSTEM) w/Device KIT 1 Act by Does not  apply route 2 (two) times daily.  Marland Kitchen glucose blood test strip Use BID  . metFORMIN (GLUCOPHAGE XR) 750 MG 24 hr tablet Take 2 tablets (1,500 mg total) by mouth daily with breakfast.  . olmesartan (BENICAR) 20 MG tablet Take 1 tablet (20 mg total) by mouth daily.  . Semaglutide (RYBELSUS) 3 MG TABS Take 1 tablet by mouth daily.     Allergies:   Patient has no known allergies.   Social History   Socioeconomic History  . Marital status: Married    Spouse name: Not on file  . Number of children: Not on file  . Years of education: Not on file  . Highest education level: Not on file  Occupational History  . Not on file  Tobacco Use  . Smoking status: Never Smoker  . Smokeless tobacco: Never Used  Substance and Sexual Activity  . Alcohol use: Yes    Alcohol/week: 2.0 standard drinks    Types: 2 Cans of beer per week  . Drug use: No  . Sexual activity: Yes  Other Topics Concern  . Not on file  Social History Narrative  . Not on file   Social Determinants of Health   Financial Resource Strain:   . Difficulty of Paying Living Expenses: Not on file  Food Insecurity:   . Worried About Charity fundraiser in the Last Year: Not  on file  . Ran Out of Food in the Last Year: Not on file  Transportation Needs:   . Lack of Transportation (Medical): Not on file  . Lack of Transportation (Non-Medical): Not on file  Physical Activity:   . Days of Exercise per Week: Not on file  . Minutes of Exercise per Session: Not on file  Stress:   . Feeling of Stress : Not on file  Social Connections:   . Frequency of Communication with Friends and Family: Not on file  . Frequency of Social Gatherings with Friends and Family: Not on file  . Attends Religious Services: Not on file  . Active Member of Clubs or Organizations: Not on file  . Attends Archivist Meetings: Not on file  . Marital Status: Not on file     Family History: The patient's family history includes Hypertension in his  mother; Prostate cancer in his father; Stomach cancer in his maternal grandfather; Stroke in his mother. There is no history of Colon cancer, Esophageal cancer, Cancer, Alcohol abuse, Diabetes, Drug abuse, Early death, Heart disease, Hyperlipidemia, Kidney disease, Colon polyps, or Rectal cancer.  ROS:   Please see the history of present illness.    Review of Systems  Constitutional: Negative for chills, fever and weight loss.  HENT: Negative for hearing loss.   Eyes: Negative for blurred vision.  Respiratory: Positive for shortness of breath.   Cardiovascular: Negative for chest pain, palpitations, orthopnea, claudication, leg swelling and PND.  Gastrointestinal: Negative for blood in stool, nausea and vomiting.  Genitourinary: Negative for hematuria.  Musculoskeletal: Positive for back pain.  Neurological: Positive for dizziness. Negative for loss of consciousness.  Psychiatric/Behavioral: Negative for substance abuse.    EKGs/Labs/Other Studies Reviewed:    The following studies were reviewed today: Myoview 12/2019: Result Date: 01/05/2020  Nuclear stress EF: 52%.  The left ventricular ejection fraction is mildly decreased (45-54%).  No T wave inversion was noted during stress.  There was no ST segment deviation noted during stress.  Defect 1: There is a small defect of mild severity present in the apical septal and apex location.  Small size, mild intensity partially reversible apical/apical septal perfusion defect, may be artifact or possibly small scar with mild peri-infarct ischemia. No significant reversible ischemia. LVEF 52% with normal wall motion. This is a low risk study.  FINDINGS: Non-cardiac: See separate report from [facility].  Ascending Aorta: Normal caliber.  Pericardium: Normal.  Coronary arteries: Normal origins.  IMPRESSION: Coronary calcium score of 149. This was 64th percentile for age and sex matched controls.  EKG:  EKG is ordered  today.  The ekg ordered today demonstrates NSR with q waves in III and aVF. Similar to ECG on 06//2021.  Recent Labs: 12/13/2019: ALT 18; Hemoglobin 15.9; Platelets 202.0; TSH 1.42 04/01/2020: BUN 24; Creatinine, Ser 1.63; Potassium 3.8; Sodium 137  Recent Lipid Panel    Component Value Date/Time   CHOL 220 (H) 12/13/2019 1629   TRIG 208.0 (H) 12/13/2019 1629   HDL 39.60 12/13/2019 1629   CHOLHDL 6 12/13/2019 1629   VLDL 41.6 (H) 12/13/2019 1629   LDLCALC 96 04/05/2017 1706   LDLDIRECT 153.0 12/13/2019 1629      Physical Exam:    VS:  BP 106/70   Pulse 82   Ht _0  (1.88 m)   Wt 234 lb (106.1 kg)   SpO2 95%   BMI 30.04 kg/m     Wt Readings from Last 3 Encounters:  04/24/20  234 lb (106.1 kg)  04/01/20 236 lb (107 kg)  01/05/20 238 lb (108 kg)     GEN:  Well nourished, well developed in no acute distress HEENT: Normal NECK: No JVD; No carotid bruits CARDIAC: RRR, no murmurs, rubs, gallops RESPIRATORY:  Clear to auscultation without rales, wheezing or rhonchi  ABDOMEN: Soft, non-tender, non-distended MUSCULOSKELETAL:  No edema; No deformity  SKIN: Warm and dry NEUROLOGIC:  Alert and oriented x 3 PSYCHIATRIC:  Normal affect   ASSESSMENT:    1. DOE (dyspnea on exertion)   2. Chest pain of uncertain etiology   3. Mixed hyperlipidemia   4. Type 2 diabetes mellitus without complication, without long-term current use of insulin (HCC)    PLAN:    In order of problems listed above:  #SOB with exertion #Coronary Calcification: Patient with progressive SOB on exertion over the past 1.5 years. Myoview with possible apical scar vs artifact with ? Peri-infarct ischemia. EF 52%. Coronary calcium score 162. Given progressive nature of symptoms and decreased exercise capacity, concern for underlying angina.  -Plan for coronary CTA if renal function returns normal on repeat labs -Check TTE -Repeat BMET today to ensure renal function normal -Continue ASA 56m daily and  atorvastatin 42mdaily  #HLD: #Elevated TG Recently resumed atorvastatin after LDL 153. -Continue atorvastatin 402maily -Goal LDL <70 given CAD on CT chest -Continue vascepa -Follow-up with PCP  #DMIII: A1C 7.7. -Follow-up with PCP as scheduled -Continue metformin, statin, semaglutide and omesartan per PCP  #Elevated Cr: Noted on last lab check to be 1.6 from 1.0 in 11/2019. Patient is unaware of any renal disease. -Repeat BMET today to ensure Cr normalized  Medication Adjustments/Labs and Tests Ordered: Current medicines are reviewed at length with the patient today.  Concerns regarding medicines are outlined above.  Orders Placed This Encounter  Procedures  . CT CORONARY MORPH W/CTA COR W/SCORE W/CA W/CM &/OR WO/CM  . CT CORONARY FRACTIONAL FLOW RESERVE DATA PREP  . CT CORONARY FRACTIONAL FLOW RESERVE FLUID ANALYSIS  . Basic metabolic panel  . EKG 12-Lead  . ECHOCARDIOGRAM COMPLETE   Meds ordered this encounter  Medications  . metoprolol tartrate (LOPRESSOR) 100 MG tablet    Sig: Take one tablet by mouth 2 hours prior to CT    Dispense:  1 tablet    Refill:  0    Patient Instructions  Medication Instructions:  *If you need a refill on your cardiac medications before your next appointment, please call your pharmacy*  Lab Work: Your physician recommends that you have lab work today- BMET.  If you have labs (blood work) drawn today and your tests are completely normal, you will receive your results only by: . MMarland KitchenChart Message (if you have MyChart) OR . A paper copy in the mail If you have any lab test that is abnormal or we need to change your treatment, we will call you to review the results.  Testing/Procedures: Your physician has requested that you have cardiac CT. Cardiac computed tomography (CT) is a painless test that uses an x-ray machine to take clear, detailed pictures of your heart. For further information please visit [email or web address] follow  instruction sheet as given.  Your physician has requested that you have an echocardiogram. Echocardiography is a painless test that uses sound waves to create images of your heart. It provides your doctor with information about the size and shape of your heart and how well your heart's chambers and valves are working. This procedure takes approximately  one hour. There are no restrictions for this procedure.  Follow-Up: At [facility], you and your health needs are our priority.  As part of our continuing mission to provide you with exceptional heart care, we have created designated Provider Care Teams.  These Care Teams include your primary Cardiologist (physician) and Advanced Practice Providers (APPs -  Physician Assistants and Nurse Practitioners) who all work together to provide you with the care you need, when you need it.  We recommend signing up for the patient portal called "MyChart".  Sign up information is provided on this After Visit Summary.  MyChart is used to connect with patients for Virtual Visits (Telemedicine).  Patients are able to view lab/test results, encounter notes, upcoming appointments, etc.  Non-urgent messages can be sent to your provider as well.   To learn more about what you can do with MyChart, go to [email or web address].    Your next appointment:   3 month(s)  The format for your next appointment:   In Person  Provider:   You may see Dr. Johney Frame or one of the following Advanced Practice Providers on your designated Care Team:    Richardson Dopp, PA-C  [place], [place]    Your cardiac CT will be scheduled at one of the below locations:   [facility] [address] [place], [place] [address] [phone]  If scheduled at [facility], please arrive at the [facility] main entrance of [facility] 30 minutes prior to test start time. Proceed to the [facility] Radiology Department (first floor) to check-in and test  prep.   Please follow these instructions carefully (unless otherwise directed):  Hold all erectile dysfunction medications at least 3 days (72 hrs) prior to test.  On the Night Before the Test: . Be sure to Drink plenty of water. . Do not consume any caffeinated/decaffeinated beverages or chocolate 12 hours prior to your test. . Do not take any antihistamines 12 hours prior to your test.  On the Day of the Test: . Drink plenty of water. Do not drink any water within one hour of the test. . Do not eat any food 4 hours prior to the test. . You may take your regular medications prior to the test.  . Take metoprolol (Lopressor) 100 mg two hours prior to test.      After the Test: . Drink plenty of water. . After receiving IV contrast, you may experience a mild flushed feeling. This is normal. . On occasion, you may experience a mild rash up to 24 hours after the test. This is not dangerous. If this occurs, you can take Benadryl 25 mg and increase your fluid intake. . If you experience trouble breathing, this can be serious. If it is severe call 911 IMMEDIATELY. If it is mild, please call our office. . If you take any of these medications: Metformin please do not take 48 hours after completing test unless otherwise instructed.   Once we have confirmed authorization from your insurance company, we will call you to set up a date and time for your test. Based on how quickly your insurance processes prior authorizations requests, please allow up to 4 weeks to be contacted for scheduling your Cardiac CT appointment. Be advised that routine Cardiac CT appointments could be scheduled as many as 8 weeks after your provider has ordered it.  For non-scheduling related questions, please contact the cardiac imaging nurse navigator should you have any questions/concerns: Marchia Bond, Cardiac  Imaging Nurse Navigator Burley Saver, Interim Cardiac Imaging Nurse Navigator [facility] Direct Office Dial: [phone]   For scheduling needs, including cancellations and rescheduling, please call Vivien Rota at [phone], option 3.       Signed, [name], MD  04/24/2020 4:12 PM    [facility]

## 2020-04-24 ENCOUNTER — Other Ambulatory Visit: Payer: Self-pay

## 2020-04-24 ENCOUNTER — Ambulatory Visit (INDEPENDENT_AMBULATORY_CARE_PROVIDER_SITE_OTHER): Payer: PRIVATE HEALTH INSURANCE | Admitting: Cardiology

## 2020-04-24 ENCOUNTER — Encounter: Payer: Self-pay | Admitting: Cardiology

## 2020-04-24 VITALS — BP 106/70 | HR 82 | Ht 74.0 in | Wt 234.0 lb

## 2020-04-24 DIAGNOSIS — R079 Chest pain, unspecified: Secondary | ICD-10-CM

## 2020-04-24 DIAGNOSIS — E119 Type 2 diabetes mellitus without complications: Secondary | ICD-10-CM | POA: Diagnosis not present

## 2020-04-24 DIAGNOSIS — R0609 Other forms of dyspnea: Secondary | ICD-10-CM

## 2020-04-24 DIAGNOSIS — E782 Mixed hyperlipidemia: Secondary | ICD-10-CM

## 2020-04-24 DIAGNOSIS — R0600 Dyspnea, unspecified: Secondary | ICD-10-CM | POA: Diagnosis not present

## 2020-04-24 MED ORDER — METOPROLOL TARTRATE 100 MG PO TABS
ORAL_TABLET | ORAL | 0 refills | Status: DC
Start: 2020-04-24 — End: 2020-05-22

## 2020-04-24 NOTE — Patient Instructions (Addendum)
Medication Instructions:  *If you need a refill on your cardiac medications before your next appointment, please call your pharmacy*  Lab Work: Your physician recommends that you have lab work today- BMET.  If you have labs (blood work) drawn today and your tests are completely normal, you will receive your results only by: Marland Kitchen MyChart Message (if you have MyChart) OR . A paper copy in the mail If you have any lab test that is abnormal or we need to change your treatment, we will call you to review the results.  Testing/Procedures: Your physician has requested that you have cardiac CT. Cardiac computed tomography (CT) is a painless test that uses an x-ray machine to take clear, detailed pictures of your heart. For further information please visit [email or web address].[email or web address]. Please follow instruction sheet as given.  Your physician has requested that you have an echocardiogram. Echocardiography is a painless test that uses sound waves to create images of your heart. It provides your doctor with information about the size and shape of your heart and how well your heart's chambers and valves are working. This procedure takes approximately one hour. There are no restrictions for this procedure.  Follow-Up: At [facility], you and your health needs are our priority.  As part of our continuing mission to provide you with exceptional heart care, we have created designated Provider Care Teams.  These Care Teams include your primary Cardiologist (physician) and Advanced Practice Providers (APPs -  Physician Assistants and Nurse Practitioners) who all work together to provide you with the care you need, when you need it.  We recommend signing up for the patient portal called "MyChart".  Sign up information is provided on this After Visit Summary.  MyChart is used to connect with patients for Virtual Visits (Telemedicine).  Patients are able to view lab/test results, encounter notes, upcoming appointments, etc.   Non-urgent messages can be sent to your provider as well.   To learn more about what you can do with MyChart, go to [email or web address].    Your next appointment:   3 month(s)  The format for your next appointment:   In Person  Provider:   You may see Dr. Johney Frame or one of the following Advanced Practice Providers on your designated Care Team:    Richardson Dopp, PA-C  [place], [place]    Your cardiac CT will be scheduled at one of the below locations:   [facility] [address] [place], [place] [address] [phone]  If scheduled at [facility], please arrive at the [facility] main entrance of [facility] 30 minutes prior to test start time. Proceed to the [facility] Radiology Department (first floor) to check-in and test prep.   Please follow these instructions carefully (unless otherwise directed):  Hold all erectile dysfunction medications at least 3 days (72 hrs) prior to test.  On the Night Before the Test: . Be sure to Drink plenty of water. . Do not consume any caffeinated/decaffeinated beverages or chocolate 12 hours prior to your test. . Do not take any antihistamines 12 hours prior to your test.  On the Day of the Test: . Drink plenty of water. Do not drink any water within one hour of the test. . Do not eat any food 4 hours prior to the test. . You may take your regular medications prior to the test.  . Take metoprolol (Lopressor) 100 mg two hours prior to test.      After the Test: .  Drink plenty of water. . After receiving IV contrast, you may experience a mild flushed feeling. This is normal. . On occasion, you may experience a mild rash up to 24 hours after the test. This is not dangerous. If this occurs, you can take Benadryl 25 mg and increase your fluid intake. . If you experience trouble breathing, this can be serious. If it is severe call 911 IMMEDIATELY. If it is mild, please call our office. . If you take any  of these medications: Metformin please do not take 48 hours after completing test unless otherwise instructed.   Once we have confirmed authorization from your insurance company, we will call you to set up a date and time for your test. Based on how quickly your insurance processes prior authorizations requests, please allow up to 4 weeks to be contacted for scheduling your Cardiac CT appointment. Be advised that routine Cardiac CT appointments could be scheduled as many as 8 weeks after your provider has ordered it.  For non-scheduling related questions, please contact the cardiac imaging nurse navigator should you have any questions/concerns: Marchia Bond, Cardiac Imaging Nurse Navigator Burley Saver, Interim Cardiac Imaging Nurse [facility] Direct Office Dial: [phone]   For scheduling needs, including cancellations and rescheduling, please call Vivien Rota at [phone], option 3.

## 2020-04-25 LAB — BASIC METABOLIC PANEL
BUN/Creatinine Ratio: 22 (ref 10–24)
BUN: 20 mg/dL (ref 8–27)
CO2: 24 mmol/L (ref 20–29)
Calcium: 9.9 mg/dL (ref 8.6–10.2)
Chloride: 99 mmol/L (ref 96–106)
Creatinine, Ser: 0.93 mg/dL (ref 0.76–1.27)
GFR calc Af Amer: 100 mL/min/{1.73_m2} (ref >59)
GFR calc non Af Amer: 86 mL/min/{1.73_m2} (ref >59)
Glucose: 207 mg/dL — ABNORMAL HIGH (ref 65–99)
Potassium: 4.2 mmol/L (ref 3.5–5.2)
Sodium: 137 mmol/L (ref 134–144)

## 2020-04-30 ENCOUNTER — Other Ambulatory Visit: Payer: Self-pay

## 2020-04-30 ENCOUNTER — Other Ambulatory Visit: Payer: Self-pay | Admitting: Internal Medicine

## 2020-04-30 DIAGNOSIS — E118 Type 2 diabetes mellitus with unspecified complications: Secondary | ICD-10-CM

## 2020-04-30 MED ORDER — RYBELSUS 7 MG PO TABS: 1.0000 | ORAL_TABLET | Freq: Every day | ORAL | 0 refills | Status: DC

## 2020-05-07 ENCOUNTER — Telehealth (HOSPITAL_COMMUNITY): Payer: Self-pay | Admitting: Emergency Medicine

## 2020-05-07 NOTE — Telephone Encounter (Signed)
Reaching out to patient to offer assistance regarding upcoming cardiac imaging study; pt verbalizes understanding of appt date/time, parking situation and where to check in, pre-test NPO status and medications ordered, and verified current allergies; name and call back number provided for further questions should they arise Marchia Bond RN Navigator Cardiac Imaging [place] [facility] [phone] office [phone] cell   Pt to take metop 2 hr prior to scan. Avoiding antihistamines and ED medications prior to scan

## 2020-05-08 ENCOUNTER — Encounter (HOSPITAL_COMMUNITY): Payer: Self-pay

## 2020-05-08 ENCOUNTER — Ambulatory Visit (HOSPITAL_COMMUNITY)
Admission: RE | Admit: 2020-05-08 | Discharge: 2020-05-08 | Disposition: A | Discharge status: Home / Self Care | Payer: PRIVATE HEALTH INSURANCE | Source: Ambulatory Visit | Attending: Cardiology | Admitting: Cardiology

## 2020-05-08 ENCOUNTER — Encounter: Payer: Self-pay | Admitting: *Deleted

## 2020-05-08 ENCOUNTER — Other Ambulatory Visit: Payer: Self-pay

## 2020-05-08 DIAGNOSIS — R079 Chest pain, unspecified: Secondary | ICD-10-CM | POA: Diagnosis present

## 2020-05-08 DIAGNOSIS — I700 Atherosclerosis of aorta: Secondary | ICD-10-CM

## 2020-05-08 DIAGNOSIS — R0609 Other forms of dyspnea: Secondary | ICD-10-CM

## 2020-05-08 DIAGNOSIS — Z006 Encounter for examination for normal comparison and control in clinical research program: Secondary | ICD-10-CM

## 2020-05-08 DIAGNOSIS — R0600 Dyspnea, unspecified: Secondary | ICD-10-CM | POA: Diagnosis not present

## 2020-05-08 DIAGNOSIS — I2510 Atherosclerotic heart disease of native coronary artery without angina pectoris: Secondary | ICD-10-CM

## 2020-05-08 MED ORDER — IOHEXOL 350 MG/ML SOLN
80.0000 mL | Freq: Once | INTRAVENOUS | Status: AC | PRN
  Administered 2020-05-08: 80 mL via INTRAVENOUS

## 2020-05-08 MED ORDER — NITROGLYCERIN 0.4 MG SL SUBL
SUBLINGUAL_TABLET | SUBLINGUAL | Status: AC
  Administered 2020-05-08: 0.8 mg via SUBLINGUAL
  Filled 2020-05-08: qty 2

## 2020-05-08 MED ORDER — METOPROLOL TARTRATE 5 MG/5ML IV SOLN
5.0000 mg | INTRAVENOUS | Status: DC | PRN
  Administered 2020-05-08: 5 mg via INTRAVENOUS

## 2020-05-08 MED ORDER — METOPROLOL TARTRATE 5 MG/5ML IV SOLN
INTRAVENOUS | Status: AC
  Filled 2020-05-08: qty 5

## 2020-05-08 MED ORDER — NITROGLYCERIN 0.4 MG SL SUBL: 0.8000 mg | SUBLINGUAL_TABLET | Freq: Once | SUBLINGUAL | Status: AC

## 2020-05-08 NOTE — Research (Signed)
IDENTIFY Informed Consent                  Subject Name: Abhimanyu Cruces     Subject met inclusion and exclusion criteria.  The informed consent form, study requirements and expectations were reviewed with the subject and questions and concerns were addressed prior to the signing of the consent form.  The subject verbalized understanding of the trial requirements.  The subject agreed to participate in the IDENTIFY trial and signed the informed consent.  The informed consent was obtained prior to performance of any protocol-specific procedures for the subject.  A copy of the signed informed consent was given to the subject and a copy was placed in the subject's medical record.   [place] Kajsa Butrum, Research Assistant  05/08/2020  14:14 p.m.

## 2020-05-08 NOTE — Discharge Instructions (Signed)
Cardiac CT Angiogram A cardiac CT angiogram is a procedure to look at the heart and the area around the heart. It may be done to help find the cause of chest pains or other symptoms of heart disease. During this procedure, a substance called contrast dye is injected into the blood vessels in the area to be checked. A large X-ray machine, called a CT scanner, then takes detailed pictures of the heart and the surrounding area. The procedure is also sometimes called a coronary CT angiogram, coronary artery scanning, or CTA. A cardiac CT angiogram allows the health care provider to see how well blood is flowing to and from the heart. The health care provider will be able to see if there are any problems, such as:  Blockage or narrowing of the coronary arteries in the heart.  Fluid around the heart.  Signs of weakness or disease in the muscles, valves, and tissues of the heart. Tell a health care provider about:  Any allergies you have. This is especially important if you have had a previous allergic reaction to contrast dye.  All medicines you are taking, including vitamins, herbs, eye drops, creams, and over-the-counter medicines.  Any blood disorders you have.  Any surgeries you have had.  Any medical conditions you have.  Whether you are pregnant or may be pregnant.  Any anxiety disorders, chronic pain, or other conditions you have that may increase your stress or prevent you from lying still. What are the risks? Generally, this is a safe procedure. However, problems may occur, including:  Bleeding.  Infection.  Allergic reactions to medicines or dyes.  Damage to other structures or organs.  Kidney damage from the contrast dye that is used.  Increased risk of cancer from radiation exposure. This risk is low. Talk with your health care provider about: ? The risks and benefits of testing. ? How you can receive the lowest dose of radiation. What happens before the  procedure?  Wear comfortable clothing and remove any jewelry, glasses, dentures, and hearing aids.  Follow instructions from your health care provider about eating and drinking. This may include: ? For 12 hours before the procedure -- avoid caffeine. This includes tea, coffee, soda, energy drinks, and diet pills. Drink plenty of water or other fluids that do not have caffeine in them. Being well hydrated can prevent complications. ? For 4-6 hours before the procedure -- stop eating and drinking. The contrast dye can cause nausea, but this is less likely if your stomach is empty.  Ask your health care provider about changing or stopping your regular medicines. This is especially important if you are taking diabetes medicines, blood thinners, or medicines to treat problems with erections (erectile dysfunction). What happens during the procedure?   Hair on your chest may need to be removed so that small sticky patches called electrodes can be placed on your chest. These will transmit information that helps to monitor your heart during the procedure.  An IV will be inserted into one of your veins.  You might be given a medicine to control your heart rate during the procedure. This will help to ensure that good images are obtained.  You will be asked to lie on an exam table. This table will slide in and out of the CT machine during the procedure.  Contrast dye will be injected into the IV. You might feel warm, or you may get a metallic taste in your mouth.  You will be given a medicine called   nitroglycerin. This will relax or dilate the arteries in your heart.  The table that you are lying on will move into the CT machine tunnel for the scan.  The person running the machine will give you instructions while the scans are being done. You may be asked to: ? Keep your arms above your head. ? Hold your breath. ? Stay very still, even if the table is moving.  When the scanning is complete, you  will be moved out of the machine.  The IV will be removed. The procedure may vary among health care providers and hospitals. What can I expect after the procedure? After your procedure, it is common to have:  A metallic taste in your mouth from the contrast dye.  A feeling of warmth.  A headache from the nitroglycerin. Follow these instructions at home:  Take over-the-counter and prescription medicines only as told by your health care provider.  If you are told, drink enough fluid to keep your urine pale yellow. This will help to flush the contrast dye out of your body.  Most people can return to their normal activities right after the procedure. Ask your health care provider what activities are safe for you.  It is up to you to get the results of your procedure. Ask your health care provider, or the department that is doing the procedure, when your results will be ready.  Keep all follow-up visits as told by your health care provider. This is important. Contact a health care provider if:  You have any symptoms of allergy to the contrast dye. These include: ? Shortness of breath. ? Rash or hives. ? A racing heartbeat. Summary  A cardiac CT angiogram is a procedure to look at the heart and the area around the heart. It may be done to help find the cause of chest pains or other symptoms of heart disease.  During this procedure, a large X-ray machine, called a CT scanner, takes detailed pictures of the heart and the surrounding area after a contrast dye has been injected into blood vessels in the area.  Ask your health care provider about changing or stopping your regular medicines before the procedure. This is especially important if you are taking diabetes medicines, blood thinners, or medicines to treat erectile dysfunction.  If you are told, drink enough fluid to keep your urine pale yellow. This will help to flush the contrast dye out of your body. This information is not  intended to replace advice given to you by your health care provider. Make sure you discuss any questions you have with your health care provider. Document Revised: 02/01/2019 Document Reviewed: 02/01/2019 Elsevier Patient Education  2020 [employer] Testing With IV Contrast Material IV contrast material is a fluid that is used with some imaging tests. It is injected into your body through a vein. Contrast material is used when your health care providers need a detailed look at organs, tissues, or blood vessels that may not show up with the standard test. The material may be used when an X-ray, an MRI, a CT scan, or an ultrasound is done. IV contrast material may be used for imaging tests that check:  Muscles, skin, and fat.  Breasts.  Brain.  Digestive tract.  Heart.  Organs such as the liver, kidneys, lungs, bladder, and many others.  Arteries and veins. Tell a health care provider about:  Any allergies you have, especially an allergy to contrast material.  All medicines you are taking, including   metformin, beta blockers, NSAIDs (such as ibuprofen), interleukin-2, vitamins, herbs, eye drops, creams, and over-the-counter medicines.  Any problems you or family members have had with the use of contrast material.  Any blood disorders you have, such as sickle cell anemia.  Any surgeries you have had.  Any medical conditions you have or have had, especially alcohol abuse, dehydration, asthma, or kidney, liver, or heart problems.  Whether you are pregnant or may be pregnant.  Whether you are breastfeeding. Most contrast materials are safe for use in breastfeeding women. What are the risks? Generally, this is a safe procedure. However, problems may occur, including:  Headache.  Itching, skin rash, and hives.  Nausea and vomiting.  Allergic reactions.  Wheezing or difficulty breathing.  Abnormal heart rate.  Changes in blood pressure.  Throat swelling.  Kidney  damage. What happens before the procedure? Medicines Ask your health care provider about:  Changing or stopping your regular medicines. This is especially important if you are taking diabetes medicines or blood thinners.  Taking medicines such as aspirin and ibuprofen. These medicines can thin your blood. Do not take these medicines unless your health care provider tells you to take them.  Taking over-the-counter medicines, vitamins, herbs, and supplements. If you are at risk of having a reaction to the IV contrast material, you may be asked to take medicine before the procedure to prevent a reaction. General instructions  Follow instructions from your health care provider about eating or drinking restrictions.  You may have an exam or lab tests to make sure that you can safely get IV contrast material.  Ask if you will be given a medicine to help you relax (sedative) during the procedure. If so, plan to have someone take you home from the hospital or clinic. What happens during the procedure?  You may be given a sedative to help you relax.  An IV will be inserted into one of your veins.  Contrast material will be injected into your IV.  You may feel warmth or flushing as the contrast material enters your bloodstream.  You may have a metallic taste in your mouth for a few minutes.  The needle may cause some discomfort and bruising.  After the contrast material is in your body, the imaging test will be done. The procedure may vary among health care providers and hospitals. What can I expect after the procedure?  The IV will be removed.  You may be taken to a recovery area if sedation medicines were used. Your blood pressure, heart rate, breathing rate, and blood oxygen level will be monitored until you leave the hospital or clinic. Follow these instructions at home:   Take over-the-counter and prescription medicines only as told by your health care provider. ? Your health  care provider may tell you to not take certain medicines for a couple of days after the procedure. This is especially important if you are taking diabetes medicines.  If you are told, drink enough fluid to keep your urine pale yellow. This will help to remove the contrast material out of your body.  Do not drive for 24 hours if you were given a sedative during your procedure.  It is up to you to get the results of your procedure. Ask your health care provider, or the department that is doing the procedure, when your results will be ready.  Keep all follow-up visits as told by your health care provider. This is important. Contact a health care provider if:    You have redness, swelling, or pain near your IV site. Get help right away if:  You have an abnormal heart rhythm.  You have trouble breathing.  You have: ? Chest pain. ? Pain in your back, neck, arm, jaw, or stomach. ? Nausea or sweating. ? Hives or a rash.  You start shaking and cannot stop. These symptoms may represent a serious problem that is an emergency. Do not wait to see if the symptoms will go away. Get medical help right away. Call your local emergency services (911 in the [place] Do not drive yourself to the hospital. Summary  IV contrast material may be used for imaging tests to help your health care providers see your organs and tissues more clearly.  Tell your health care provider if you are pregnant or may be pregnant.  During the procedure, you may feel warmth or flushing as the contrast material enters your bloodstream.  After the procedure, drink enough fluid to keep your urine pale yellow. This information is not intended to replace advice given to you by your health care provider. Make sure you discuss any questions you have with your health care provider. Document Revised: 08/25/2018 Document Reviewed: 08/25/2018 Elsevier Patient Education  2020 [employer]  

## 2020-05-09 ENCOUNTER — Encounter: Payer: Self-pay | Admitting: *Deleted

## 2020-05-09 ENCOUNTER — Telehealth: Payer: Self-pay | Admitting: Cardiology

## 2020-05-09 DIAGNOSIS — R0600 Dyspnea, unspecified: Secondary | ICD-10-CM

## 2020-05-09 DIAGNOSIS — Z01812 Encounter for preprocedural laboratory examination: Secondary | ICD-10-CM

## 2020-05-09 DIAGNOSIS — R079 Chest pain, unspecified: Secondary | ICD-10-CM

## 2020-05-09 DIAGNOSIS — I2510 Atherosclerotic heart disease of native coronary artery without angina pectoris: Secondary | ICD-10-CM | POA: Diagnosis not present

## 2020-05-09 DIAGNOSIS — I700 Atherosclerosis of aorta: Secondary | ICD-10-CM | POA: Diagnosis not present

## 2020-05-09 DIAGNOSIS — R0609 Other forms of dyspnea: Secondary | ICD-10-CM

## 2020-05-09 NOTE — Telephone Encounter (Signed)
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 Given symptoms of decreased exercise capacity and DOE, will proceed with coronary angiography at this time. Discussed risks and benefits with the patient as detailed below and he agreed to proceed. Will arrange for out-patient cath.  Continue ASA 81mg , atorvastatin 40mg , ARB, and metoprolol. Will repeat lipid panel at next visit now that he has resumed atorvastatin.  INFORMED CONSENT: I have reviewed the risks, indications, and alternatives to cardiac catheterization, possible angioplasty, and stenting with the patient. Risks include but are not limited to bleeding, infection, vascular injury, stroke, myocardial infection, arrhythmia, kidney injury, radiation-related injury in the case of prolonged fluoroscopy use, emergency cardiac surgery, and death. The patient understands the risks of serious complication is 1-2 in [id number] with diagnostic cardiac cath and 1-2% or less with angioplasty/stenting.

## 2020-05-14 NOTE — Addendum Note (Signed)
Addended by: Katrine Coho on: 05/14/2020 03:07 PM   Modules accepted: Orders

## 2020-05-20 ENCOUNTER — Other Ambulatory Visit (HOSPITAL_COMMUNITY)
Admission: RE | Admit: 2020-05-20 | Discharge: 2020-05-20 | Disposition: A | Discharge status: Home / Self Care | Payer: PRIVATE HEALTH INSURANCE | Source: Ambulatory Visit | Attending: Interventional Cardiology | Admitting: Interventional Cardiology

## 2020-05-20 DIAGNOSIS — Z20822 Contact with and (suspected) exposure to covid-19: Secondary | ICD-10-CM | POA: Insufficient documentation

## 2020-05-20 DIAGNOSIS — Z01812 Encounter for preprocedural laboratory examination: Secondary | ICD-10-CM | POA: Insufficient documentation

## 2020-05-20 LAB — SARS CORONAVIRUS 2 (TAT 6-24 HRS): SARS Coronavirus 2: NEGATIVE

## 2020-05-21 ENCOUNTER — Other Ambulatory Visit: Payer: PRIVATE HEALTH INSURANCE | Admitting: *Deleted

## 2020-05-21 ENCOUNTER — Other Ambulatory Visit: Payer: Self-pay

## 2020-05-21 ENCOUNTER — Ambulatory Visit (HOSPITAL_COMMUNITY): Payer: PRIVATE HEALTH INSURANCE | Attending: Cardiology

## 2020-05-21 ENCOUNTER — Telehealth: Payer: Self-pay | Admitting: *Deleted

## 2020-05-21 DIAGNOSIS — R079 Chest pain, unspecified: Secondary | ICD-10-CM | POA: Diagnosis present

## 2020-05-21 DIAGNOSIS — R0600 Dyspnea, unspecified: Secondary | ICD-10-CM

## 2020-05-21 DIAGNOSIS — R0609 Other forms of dyspnea: Secondary | ICD-10-CM

## 2020-05-21 DIAGNOSIS — Z01812 Encounter for preprocedural laboratory examination: Secondary | ICD-10-CM

## 2020-05-21 LAB — BASIC METABOLIC PANEL
BUN/Creatinine Ratio: 17 (ref 10–24)
BUN: 14 mg/dL (ref 8–27)
CO2: 25 mmol/L (ref 20–29)
Calcium: 10.1 mg/dL (ref 8.6–10.2)
Chloride: 101 mmol/L (ref 96–106)
Creatinine, Ser: 0.81 mg/dL (ref 0.76–1.27)
GFR calc Af Amer: 109 mL/min/{1.73_m2} (ref >59)
GFR calc non Af Amer: 94 mL/min/{1.73_m2} (ref >59)
Glucose: 123 mg/dL — ABNORMAL HIGH (ref 65–99)
Potassium: 4.3 mmol/L (ref 3.5–5.2)
Sodium: 137 mmol/L (ref 134–144)

## 2020-05-21 LAB — CBC
Hematocrit: 46 % (ref 37.5–51.0)
Hemoglobin: 15.8 g/dL (ref 13.0–17.7)
MCH: 30.5 pg (ref 26.6–33.0)
MCHC: 34.3 g/dL (ref 31.5–35.7)
MCV: 89 fL (ref 79–97)
Platelets: 197 x10E3/uL (ref 150–450)
RBC: 5.18 x10E6/uL (ref 4.14–5.80)
RDW: 13.1 % (ref 11.6–15.4)
WBC: 6.6 x10E3/uL (ref 3.4–10.8)

## 2020-05-21 LAB — ECHOCARDIOGRAM COMPLETE
Area-P 1/2: 2.8 cm2
S' Lateral: 2.7 cm

## 2020-05-21 MED ORDER — PERFLUTREN LIPID MICROSPHERE
1.0000 mL | INTRAVENOUS | Status: AC | PRN
  Administered 2020-05-21: 2 mL via INTRAVENOUS

## 2020-05-21 NOTE — Telephone Encounter (Signed)
Pt contacted pre-catheterization scheduled at [facility] for: Wednesday May 22, 2020 10 AM Verified arrival time and place: [facility]) at: 8 AM   No solid food after midnight prior to cath, clear liquids until 5 AM day of procedure.  Hold: Metformin-day of procedure and 48 hours post procedure [place]-AM of procedure  Except hold medications AM meds can be  taken pre-cath with sips of water including: ASA 81 mg   Confirmed patient has responsible adult to drive home post procedure and be with patient first 24 hours after arriving home: yes  You are allowed ONE visitor in the waiting room during the time you are at the hospital for your procedure. Both you and your visitor must wear a mask once you enter the hospital.       COVID-19 Pre-Screening Questions:   In the past 14 days have you had any symptoms concerning for COVID-19 infection (fever, chills, cough, or new shortness of breath)? no  In the past 14 days have you been around anyone with known Covid 19? no   Reviewed procedure/mask/visitor instructions, COVID-19 questions reviewed with patient.

## 2020-05-22 ENCOUNTER — Ambulatory Visit (HOSPITAL_COMMUNITY)
Admission: RE | Disposition: A | Discharge status: Home / Self Care | Payer: Self-pay | Source: Home / Self Care | Attending: Cardiovascular Disease

## 2020-05-22 ENCOUNTER — Ambulatory Visit (HOSPITAL_COMMUNITY)
Admission: RE | Admit: 2020-05-22 | Discharge: 2020-05-22 | Disposition: A | Discharge status: Home / Self Care | Payer: PRIVATE HEALTH INSURANCE | Attending: Cardiovascular Disease | Admitting: Cardiovascular Disease

## 2020-05-22 ENCOUNTER — Other Ambulatory Visit: Payer: Self-pay

## 2020-05-22 DIAGNOSIS — E785 Hyperlipidemia, unspecified: Secondary | ICD-10-CM | POA: Diagnosis present

## 2020-05-22 DIAGNOSIS — I10 Essential (primary) hypertension: Secondary | ICD-10-CM | POA: Diagnosis present

## 2020-05-22 DIAGNOSIS — E782 Mixed hyperlipidemia: Secondary | ICD-10-CM | POA: Insufficient documentation

## 2020-05-22 DIAGNOSIS — E119 Type 2 diabetes mellitus without complications: Secondary | ICD-10-CM | POA: Insufficient documentation

## 2020-05-22 DIAGNOSIS — R0602 Shortness of breath: Secondary | ICD-10-CM | POA: Diagnosis not present

## 2020-05-22 DIAGNOSIS — Z8249 Family history of ischemic heart disease and other diseases of the circulatory system: Secondary | ICD-10-CM | POA: Diagnosis not present

## 2020-05-22 DIAGNOSIS — Z7982 Long term (current) use of aspirin: Secondary | ICD-10-CM | POA: Insufficient documentation

## 2020-05-22 DIAGNOSIS — I25110 Atherosclerotic heart disease of native coronary artery with unstable angina pectoris: Secondary | ICD-10-CM

## 2020-05-22 DIAGNOSIS — N400 Enlarged prostate without lower urinary tract symptoms: Secondary | ICD-10-CM | POA: Insufficient documentation

## 2020-05-22 DIAGNOSIS — Z955 Presence of coronary angioplasty implant and graft: Secondary | ICD-10-CM

## 2020-05-22 DIAGNOSIS — K219 Gastro-esophageal reflux disease without esophagitis: Secondary | ICD-10-CM | POA: Diagnosis not present

## 2020-05-22 DIAGNOSIS — Z79899 Other long term (current) drug therapy: Secondary | ICD-10-CM | POA: Diagnosis not present

## 2020-05-22 DIAGNOSIS — Z7984 Long term (current) use of oral hypoglycemic drugs: Secondary | ICD-10-CM | POA: Diagnosis not present

## 2020-05-22 DIAGNOSIS — E118 Type 2 diabetes mellitus with unspecified complications: Secondary | ICD-10-CM

## 2020-05-22 HISTORY — PX: LEFT HEART CATH AND CORONARY ANGIOGRAPHY: CATH118249

## 2020-05-22 HISTORY — PX: CORONARY STENT INTERVENTION: CATH118234

## 2020-05-22 LAB — GLUCOSE, CAPILLARY: Glucose-Capillary: 129 mg/dL — ABNORMAL HIGH (ref 70–99)

## 2020-05-22 LAB — POCT ACTIVATED CLOTTING TIME: Activated Clotting Time: 285 seconds

## 2020-05-22 SURGERY — LEFT HEART CATH AND CORONARY ANGIOGRAPHY
Anesthesia: LOCAL

## 2020-05-22 MED ORDER — ATORVASTATIN CALCIUM 40 MG PO TABS: 80.0000 mg | ORAL_TABLET | Freq: Every day | ORAL | 1 refills | Status: DC

## 2020-05-22 MED ORDER — SODIUM CHLORIDE 0.9 % IV SOLN: INTRAVENOUS | Status: DC

## 2020-05-22 MED ORDER — HYDRALAZINE HCL 20 MG/ML IJ SOLN: 10.0000 mg | INTRAMUSCULAR | Status: DC | PRN

## 2020-05-22 MED ORDER — FENTANYL CITRATE (PF) 100 MCG/2ML IJ SOLN
INTRAMUSCULAR | Status: DC | PRN
  Administered 2020-05-22 (×2): 50 ug via INTRAVENOUS

## 2020-05-22 MED ORDER — LIDOCAINE HCL (PF) 1 % IJ SOLN
INTRAMUSCULAR | Status: DC | PRN
  Administered 2020-05-22: 2 mL

## 2020-05-22 MED ORDER — SODIUM CHLORIDE 0.9% FLUSH: 3.0000 mL | INTRAVENOUS | Status: DC | PRN

## 2020-05-22 MED ORDER — VERAPAMIL HCL 2.5 MG/ML IV SOLN
INTRAVENOUS | Status: DC | PRN
  Administered 2020-05-22: 10 mL via INTRA_ARTERIAL

## 2020-05-22 MED ORDER — CLOPIDOGREL BISULFATE 300 MG PO TABS
ORAL_TABLET | ORAL | Status: DC | PRN
  Administered 2020-05-22: 600 mg via ORAL

## 2020-05-22 MED ORDER — HEPARIN (PORCINE) IN NACL 1000-0.9 UT/500ML-% IV SOLN
INTRAVENOUS | Status: AC
  Filled 2020-05-22: qty 1000

## 2020-05-22 MED ORDER — LIDOCAINE HCL (PF) 1 % IJ SOLN
INTRAMUSCULAR | Status: AC
  Filled 2020-05-22: qty 30

## 2020-05-22 MED ORDER — ONDANSETRON HCL 4 MG/2ML IJ SOLN: 4.0000 mg | Freq: Four times a day (QID) | INTRAMUSCULAR | Status: DC | PRN

## 2020-05-22 MED ORDER — LABETALOL HCL 5 MG/ML IV SOLN: 10.0000 mg | INTRAVENOUS | Status: DC | PRN

## 2020-05-22 MED ORDER — NITROGLYCERIN 1 MG/10 ML FOR IR/CATH LAB
INTRA_ARTERIAL | Status: AC
  Filled 2020-05-22: qty 10

## 2020-05-22 MED ORDER — MIDAZOLAM HCL 2 MG/2ML IJ SOLN
INTRAMUSCULAR | Status: DC | PRN
  Administered 2020-05-22: 2 mg via INTRAVENOUS

## 2020-05-22 MED ORDER — SODIUM CHLORIDE 0.9 % IV SOLN: 250.0000 mL | INTRAVENOUS | Status: DC | PRN

## 2020-05-22 MED ORDER — HEPARIN SODIUM (PORCINE) 1000 UNIT/ML IJ SOLN
INTRAMUSCULAR | Status: AC
  Filled 2020-05-22: qty 1

## 2020-05-22 MED ORDER — SODIUM CHLORIDE 0.9% FLUSH: 3.0000 mL | Freq: Two times a day (BID) | INTRAVENOUS | Status: DC

## 2020-05-22 MED ORDER — FENTANYL CITRATE (PF) 100 MCG/2ML IJ SOLN
INTRAMUSCULAR | Status: AC
  Filled 2020-05-22: qty 2

## 2020-05-22 MED ORDER — HEPARIN SODIUM (PORCINE) 1000 UNIT/ML IJ SOLN
INTRAMUSCULAR | Status: DC | PRN
  Administered 2020-05-22: 6000 [IU] via INTRAVENOUS
  Administered 2020-05-22: 5000 [IU] via INTRAVENOUS

## 2020-05-22 MED ORDER — CLOPIDOGREL BISULFATE 75 MG PO TABS: 75.0000 mg | ORAL_TABLET | Freq: Every day | ORAL | Status: DC

## 2020-05-22 MED ORDER — ASPIRIN 81 MG PO CHEW: 81.0000 mg | CHEWABLE_TABLET | ORAL | Status: DC

## 2020-05-22 MED ORDER — FAMOTIDINE IN NACL 20-0.9 MG/50ML-% IV SOLN
INTRAVENOUS | Status: AC | PRN
  Administered 2020-05-22: 20 mg via INTRAVENOUS

## 2020-05-22 MED ORDER — VERAPAMIL HCL 2.5 MG/ML IV SOLN
INTRAVENOUS | Status: AC
  Filled 2020-05-22: qty 2

## 2020-05-22 MED ORDER — METFORMIN HCL ER 750 MG PO TB24: 1500.0000 mg | ORAL_TABLET | Freq: Every day | ORAL | 1 refills | Status: AC

## 2020-05-22 MED ORDER — IOHEXOL 350 MG/ML SOLN
INTRAVENOUS | Status: DC | PRN
  Administered 2020-05-22: 140 mL

## 2020-05-22 MED ORDER — ACETAMINOPHEN 325 MG PO TABS: 650.0000 mg | ORAL_TABLET | ORAL | Status: DC | PRN

## 2020-05-22 MED ORDER — SODIUM CHLORIDE 0.9 % WEIGHT BASED INFUSION
3.0000 mL/kg/h | INTRAVENOUS | Status: AC
  Administered 2020-05-22: 3 mL/kg/h via INTRAVENOUS

## 2020-05-22 MED ORDER — MIDAZOLAM HCL 2 MG/2ML IJ SOLN
INTRAMUSCULAR | Status: AC
  Filled 2020-05-22: qty 2

## 2020-05-22 MED ORDER — CLOPIDOGREL BISULFATE 75 MG PO TABS: 75.0000 mg | ORAL_TABLET | Freq: Every day | ORAL | 3 refills | Status: AC

## 2020-05-22 MED ORDER — CLOPIDOGREL BISULFATE 300 MG PO TABS
ORAL_TABLET | ORAL | Status: AC
  Filled 2020-05-22: qty 2

## 2020-05-22 MED ORDER — SODIUM CHLORIDE 0.9 % WEIGHT BASED INFUSION: 1.0000 mL/kg/h | INTRAVENOUS | Status: DC

## 2020-05-22 MED ORDER — METOPROLOL TARTRATE 25 MG PO TABS: 25.0000 mg | ORAL_TABLET | Freq: Two times a day (BID) | ORAL | 1 refills | Status: DC

## 2020-05-22 MED ORDER — PANTOPRAZOLE SODIUM 40 MG PO TBEC: 40.0000 mg | DELAYED_RELEASE_TABLET | Freq: Every day | ORAL | 3 refills | Status: AC

## 2020-05-22 MED FILL — CLOPIDOGREL 75 MG TABLET: 75 | 30 days supply | Qty: 30 | Fill #0

## 2020-05-22 MED FILL — PANTOPRAZOLE SOD DR 40 MG T: 40 | 30 days supply | Qty: 30 | Fill #0

## 2020-05-22 MED FILL — METFORMIN HCL ER 750 MG TAB: 750 | 30 days supply | Qty: 60 | Fill #0

## 2020-05-22 MED FILL — METOPROLOL TARTRATE 25 MG T: 25 | 30 days supply | Qty: 60 | Fill #0

## 2020-05-22 SURGICAL SUPPLY — 16 items
BALLN EMERGE MR 2.0X12 (BALLOONS) ×2
BALLOON EMERGE MR 2.0X12 (BALLOONS) ×1 IMPLANT
CATH 5FR JL3.5 JR4 ANG PIG MP (CATHETERS) ×2 IMPLANT
CATH VISTA GUIDE 6FR XBLAD3.5 (CATHETERS) ×2 IMPLANT
DEVICE RAD COMP TR BAND LRG (VASCULAR PRODUCTS) ×2 IMPLANT
GLIDESHEATH SLEND SS 6F .021 (SHEATH) ×2 IMPLANT
GUIDEWIRE INQWIRE 1.5J.035X260 (WIRE) ×1 IMPLANT
INQWIRE 1.5J .035X260CM (WIRE) ×2
KIT ENCORE 26 ADVANTAGE (KITS) ×2 IMPLANT
KIT HEART LEFT (KITS) ×2 IMPLANT
PACK CARDIAC CATHETERIZATION (CUSTOM PROCEDURE TRAY) ×2 IMPLANT
STENT SYNERGY XD 2.25X24 (Permanent Stent) ×1 IMPLANT
SYNERGY XD 2.25X24 (Permanent Stent) ×2 IMPLANT
TRANSDUCER W/STOPCOCK (MISCELLANEOUS) ×2 IMPLANT
TUBING CIL FLEX 10 FLL-RA (TUBING) ×2 IMPLANT
WIRE COUGAR XT STRL 190CM (WIRE) ×2 IMPLANT

## 2020-05-22 NOTE — Research (Addendum)
Reveal Plaque Study  Screen failure  REVEAL PLAQUE Informed Consent   Subject Name: Randy Rodriguez    Subject: [id number]  Subject met inclusion and exclusion criteria.  The informed consent form, study requirements and expectations were reviewed with the subject and questions and concerns were addressed prior to the signing of the consent form.  The subject verbalized understanding of the trial requirements.  The subject agreed to participate in the [place] trial and signed the informed consent at 0900 on 05/22/2020.  The informed consent was obtained prior to performance of any protocol-specific procedures for the subject.  A copy of the signed informed consent was given to the subject and a copy was placed in the subject's medical record.   Mat [place]   Inclusion: _0  1. Age >18 _1  2. Clinically stable patient with known CAD. _2   3. CCTA showing stenosis in at least one major epicardial vessel of stentable/graftable diameter, in whom clinically indicated IVUS is planned within 45 days of the CCTA  and FFR CT available. _3  4. FFR CT successfully processed. _4  5. Willing to comply with protocol. _5  6. Agrees to be included in the study and able to provide written informed consent.  Exclusion: _6  1. CCTA showing no stenosis. _7  2. Uninterpretable CCTA by HeartFlow assessment, in which image quality prevents FFR CT from being processed. _8  3. Acute chest pain. _9  4. CABG prior to CCTA acquisition. _10  5. Prior history of PCI for 3 or more vessels. _11  6. MI less than 30 days prior to CCTA or between CCTA and ICA. _12  7. Suspicion of acute coronary syndrome, Acute MI or Unstable Angina. _13  8. Known complex congenital heart disease. _14  9. Tachycardia or significant arrythmia. _15  10. Subject requires an emergent procedure. _16  11. Evidence of ongoing or active clinical instability, including acute chest pain  (sudden onset), cardiogenic shock, unstable blood pressure with systolic blood  pressure <31 mmHg, and severe congestive heart failure (NYHA lll or lV) or acute  pulmonary edema.  _17  12. Any active, serious, life-threatening disease with a life expectancy of less than 2 months. _18  13. Currently enrolled in another study utilizing FFR CT or in an investigational trial that involves a non-approved cardiac drug or device.  _19  14. Persons under the protection of justice, guardianship, or curatorship.  Reason for CCTA scan: _20  Chest Pain _21  Asymptomatic/screening     _22  Prior MI     _23  Dyspnea   _24  Palpitations _25  Abnormal ECG    _26    Abnormal Stress or Perfusion   _27  Other Which CT Scanner was used?          _28  Philips Model: _29  CT 5000 Ingenuity    _30  Spectral CT 7500   _31  CT6000 iCT    _32  IQon         _33   Incisive CT    _34   Other _35  Siemens Model: _36  Somatom Definition Edge    _37  Somatom Definition Flash            _38  Somatom Force  _39  Somatom Drive   <[id number]>_7<\/AJOINOMVEHMCNOBS>_96   Other _41  Product/process development scientist:  _42  Revolution CT  _43  CardioGraph   _44  Optima CT660  _45   Discovery HD 750   _46  Other Radiation Exposure for CCTA Only (Sum for All Coronary Series)  CT dose index (CTDI) (mGy)  _______6542______________  Dose-length product (DLP) (mGy-cm) ______804.6__________   _47   CCTA Image Uploaded   _48  CCTA report uploaded  _49  All subject  information redacted from image and report? Subject Demographics: Age: __64___    Year of birth:  __1957____ Birth Sex: _0  Male   _1   Male  Weight: _104.3___ _2 lb  _3  kg   _4  Not done Height: _74_____ _5  in  _6  cm   _7  Not done  Ethnicity: _8  Not of Hispanic, Latino/a, or Spanish origin _9  Hispanic, Latino/a, or Spanish origin Specify: _10   [place] American, Chicano/a      _11  [place]  _12  [place] _13 other Hispanic, Latino/a, or Spanish origin Race:  _14  White  _15  Black  _16  American [place] or [place] Native  _17  Asian:  Specify: _18  Asian [place]   _19  [place]  _20  [place]  _21  [place]   _22  Filipino  _23  Other Asian  _24  [place]   _25  Native  Hawaiian or other Pacific Islander              _26  Native Hawaiian   _27  Guamanian or Chamorro  _28  Samoan   _29  Other Pacific Islander   _30  Other (specify) Medical History: Angina within the last 45 days:   _31  Yes   _32  No If yes: _33  Typical _34  Atypical  _35  Dyspnea  _36  Noncardiac pain Angina type: _37  Stable _38  Unstable _39  Silent Ischemia  _40  Unknown _41  Other Congestive Heart Failure: _42  Yes _43  No Diabetes: _44  Yes  _45  No If yes:  _46  Type l   _47  Type ll Controlled by:  _48  Insulin   _49  Oral hypoglycemic  _50  Diet  _51   unknown Hypertension:  _52  Yes  <MEQASTMHDQQIWLNL>_8<\/XQJJHERDEYCXKGYJ>_85   No  (Systolic >631, diastolic >49 or req. medication) Is subject taking anti-hypertensive medication?   _54  Yes  _55   No Hyperlipidemia:   _56  Yes  _57  No Is subject taking anti-hyperlipidemic medication?    _58  Yes  _59  No Tobacco Use:  _60  Former  _61  Current  _62   Non-smoker  _63   Unknown Any nicotine use in 24 hours prior to CCTA?   _64  Yes  _65   No Family history of premature atherosclerotic disease?   _66  Yes  _67   No (Coronary artery disease, cerebrovascular disease, or peripheral vascular disease for male 2o relatives < 79 and/or male 2o relatives < 41 years old) Stroke:  _68  Yes  _69  No Transient Ischemic Attack (TIA):   _70  Yes  _71  No Peripheral Vascular Disease:  _72  Yes  _73  No Sedentary Lifestyle:    _74  Yes   _75  No Other Relevant Disease or Comorbidity:    _76  Yes  _77  No Specify: ___________________   PCI History: Does the subject have previously stented vessels?   _78  Yes  _79  No ______________ Total number of PCI procedures ______________ Date of most recent PCI ______________ Total number of stented vessels NOTE: CASS Segment Diagram is provided for reference in Appendix A. PCI History Records: (complete a record for each stent) Stent 1:           Vessel: _80  Right coronary artery  _81  Left main artery  _82  Left circumflex artery _83  Left anterior descending artery   _84  Other________________ CASS Segment for starting location of stent:  ______________________________ CASS Segment for ending location of stent: _______________________________ Stent manufacturer: _________________________________________________ Diameter of stent (mm): _____________   Length of stent (mm): _____________ Stent 2:             Vessel: _85  Right coronary artery  _86  Left main artery  _87  Left circumflex artery _88  Left anterior descending artery   _89  Other________________ CASS  Segment for starting location of stent: ______________________________ CASS Segment for ending location of stent: _______________________________ Stent manufacturer: _________________________________________________ Diameter of stent (mm): _____________   Length of stent (mm): _____________ Coronary Tests: Any coronary tests within 90 days prior to enrollment?  _0  Yes  _1  No Invasive Coronary Angiography (ICA):    _2  Yes _3  No Most recent test date: _________________   Result: _4  Positive  _5  Negative  _6  Intermediate  _7  Unknown _8  Image uploaded       Report: _9  Uploaded  _10  N/A _11  All subject information redacted from images and reports? Exercise Tolerance Test (ETT):  _12 Yes  _13  No     Most recent test date: Click or tap to enter a date. Result:   _14  Positive    _15  Negative  _16  Intermediate  _17   Unknown _18  Image uploaded    Report:  _19  Uploaded  _20  N/A  _21   All subject information redacted from images and reports? Stress Echocardiogram: _22  Yes  _23  No   Most recent date:Click or tap to enter a date. Result:  _24  Positive  _25  Negative  _26  Intermediate  _27  Unknown _28 Image uploaded    Report:  _29  Uploaded  _30  N/A      _31  All images redacted? Nuclear Myocardial Perfusion Scan:    _32  Yes  _33  No Most recent test date: Click or tap to enter a date.  Result:   _34  Positive    _35  Negative  _36  Intermediate  _37   Unknown _38 Image uploaded    Report:  _39  Uploaded  _40  N/A      _41  All images redacted? Cardiac MRI:   _42  Yes  _43  No Most recent test date: Click or tap to enter a  date.  Result:   _44  Positive    _45  Negative  _46  Intermediate  _47   Unknown _48 Image uploaded    Report:  _49  Uploaded  _50  N/A      _51  All images redacted? Cardiac PET:    _52  Yes  _53  No Most recent test date: Click or tap to enter a date.  Result:   _54  Positive    _55  Negative  _56  Intermediate  _57   Unknown _58 Image uploaded    Report:  _59  Uploaded  _60  N/A      _61  All images redacted?  Baseline Cath Procedure: Date of procedure: Click or tap to enter a date. Blood pressure prior to procedure:   Heart Rate prior to procedure: Time of arterial access: Time of first lesion treatment: Time last guide catheter removed: _______________ Aortic pressure (mean mmHg): __________________ LVED pressure (mmHg): ________________________ Arterial access site:   _62  Radial  _63  Femoral  _64  Brachial Maximum arterial sheath size (Fr) _______________ Venous access needle size:  _65  18 G  _66  20 G  _67  4 FR  _68  5 FR  _69  6 FR  _70  Other Left ventriculography performed?  _71  Yes  _72  No If yes, when:   _73  Pre-intervention  _74  Post- intervention LVEF (%):_______________ Were there any procedural complications?          _75  Yes  _76  No _77  Dissection from wire  _78  Dissection from catheter  _79  Slow / no flow _80  Allergic reaction to medication  _81  Side branch occlusion  _82  Other  Was the FFRCT model available & used in cath lab during the procedure? _83  Y  _84  N _85  Angio image uploaded  _86  Cath report uploaded  _87  All subject information redacted from  images and reports?  Intraprocedural Medications: Was adenosine administered within 24 hrs of the cath procedure?  _0  Yes  _1  No IV Adenosine start time: _______________ Adenosine dose:  _2  140 ug/kg/min  _3  Other dose  _4  None Was nitroglycerin administered within 24 hrs of the cath procedure? _5  Yes _6  No NTG dose: _____________ NTG Route: _7  IV  _8  IC  _9  Other  Intraprocedural Devices: (total number of each device used during the procedure): _________ Pressure wires & FFR  wires _________ Guidewires   (Do not include wires used before intervention and pressure/FFR wires) _________ Balloon catheters _________ Drug eluting stents _________ Bare metal stents _________ Guide catheters _________ Intravascular ultrasound catheters _________ Intravascular ultrasound OCT catheters _________ Thrombectomy and atherectomy catheters _________ Temporary pacemakers _________ Intra-aortic balloon pumps (IABP) Intraprocedural Radiation exposure: Total fluoroscopy time _________________ (minutes) Absorbed Dose of Radiation _____________   _10  mGy  _11  Gy Dose Area Product: ____________________  _12  Gy*cm2  _13  cGy*cm2  _14  mGy*cm2  Other: ______________ Contrast Use: (check all that apply) _15  Ominipaque  _16  Visipaque  _17   Isovue  _18  Iomeron  _19  Ultravist   _20 Oxilan _21  Hypaque  _22  Isopaque  _23  Hexibrix  _24  OptiRay  _25  Iopamidol  Total amount of contrast used (mL): ______________  IVUS/OCT: Model: _26  Philips Volcano                    Catheter type:    _27  Revolution  _28  Eagle Eye Platinum  _29  Eagle Eye  P     Platinum ST          _30  Refinity ST               _31  [employer]                   Catheter type:  _32  Opticross   _33  Opticross HD  _34  Other                _35  Terumo                    Catheter type:   _36  ViewIT    _37  Intrafocus WR  Ivus Image uploaded:  _38  Yes  _39  No OCT taken?   _40  Yes   _41  No   _42  All subject information redacted from images and reports?  IVUS/OCT Records Was automatic pullback used for IVUS/OCT acquisition?   _43  Yes  _44  No       Pullback speed (mm/sec): _____________________        Length of pullback (mm): _____________________ Vessel 1: Image type:   _45  IVUS   _46  OCT  _47  Right coronary artery  _48  Left main artery  _49  Left circumflex artery _50  Left anterior descending artery   _51  Other________________ CASS Segment for starting location of guidewire: __________________________ CASS Segment for ending location of guidewire:  ________________________ Guide catheter size:  _52 4 FR  _53 5 FR  _54 6 FR  _55 7 FR  _56 8 FR Vessel 2: Image type:   _57  IVUS   _58  OCT  _59  Right coronary artery  _60  Left main artery  _61  Left circumflex artery _62  Left anterior descending artery   _63  Other________________ CASS Segment for starting location of guidewire: __________________________ CASS Segment for ending location of guidewire: ________________________ Guide catheter size:  _64 4 FR  _65 5 FR  _66 6 FR  _67 7 FR  _68 8 FR Vessel 3: Image type:   _69  IVUS   _70  OCT  _71   Right coronary artery  _0  Left main artery  _1  Left circumflex artery _2  Left anterior descending artery   _3  Other________________ CASS Segment for starting location of guidewire: __________________________ CASS Segment for ending location of guidewire: ________________________ Guide catheter size:  _4 4 FR  _5 5 FR  _6 6 FR  _7 7 FR  _8 8 FR FFR/NHPR Records Measurement taken during cath procedure: _9  FFR  _10  DFR  _11  IFR  _12  RFR   _13 Other Vessel 1: _14  Right coronary artery  _15  Left main artery  _16  Left circumflex artery _17  Left anterior descending artery   _18  Other________________ Contrast used?     _19  Yes   _20  No CASS Segment for starting location of guide wire: __________________________ CASS Segment for ending location of guide wire: ___________________________ Guide catheter size:  _21 4 FR  _22 5 FR  _23 6 FR  _24 7 FR  _25 8 FR FFR Value: _____________ Pd value: ______________ Pa value: ______________ NHPR value: ____________ Vessel 2: _26  Right coronary artery  _27  Left main artery  _28  Left circumflex artery _29  Left anterior descending artery   _30  Other________________ Contrast used?     _31  Yes   _32  No CASS Segment for starting location of guide wire: __________________________ CASS Segment for ending location of guide wire: ___________________________ Guide catheter size:  _33 4 FR  _34 5 FR  _35 6 FR  _36 7 FR  _37 8 FR FFR Value: _____________ Pd value: ______________ Pa value:  ______________ NHPR value: ____________  Vessel 3: _38  Right coronary artery  _39  Left main artery  _40  Left circumflex artery _41  Left anterior descending artery   _42  Other________________ Contrast used?     _43  Yes   _44  No CASS Segment for starting location of guide wire: __________________________ CASS Segment for ending location of guide wire: ___________________________ Guide catheter size:  _45 4 FR  _46 5 FR  _47 6 FR  _48 7 FR  _49 8 FR FFR Value: _____________ Pd value: ______________ Pa value: ______________ NHPR value: ____________ FFR Uploads _50  Angio Image  _51  FFR Report  _52  FFR Tracings  _53  Screenshot of Angio - starting position of FFR guidewire without contrast _54  Screenshot of Angio - starting position of FFR guidewire with contrast  NHPR Uploads _55  Angio Image  _56  NHPR Report  _57  NHPR Tracings  _58  Screenshot of Angio - starting position of NHPR guidewire without contrast _59  Screenshot of Angio - starting position of NHPR guidewire with contrast _60  All subject information redacted from images and reports?  Study completion:  Did subject complete participation in the study?    _61  Yes  _62  No If no, reason:                         _63  Death _64  Withdrew consent  _65  Other  _66  Screen Failure Screen Failure reason:  _67  Ivus not used  _68  Use of balloon prior  _69  Automatic pullback not used

## 2020-05-22 NOTE — Progress Notes (Signed)
Pt ambulated without difficulty or bleeding.   Discharged home with wife who will drive and stay with pt x 24 hrs 

## 2020-05-22 NOTE — Progress Notes (Signed)
CARDIAC REHAB PHASE I   Discussed stent, Plavix, restrictions, diet, exercise, NTG and CRPII. Pt voiced understanding. Will refer to [place], probably virtual due to distance. Pt is interested in participating in Virtual Cardiac and Pulmonary Rehab. Pt advised that Virtual Cardiac and Pulmonary Rehab is provided at no cost to the patient.  Checklist:  1. Pt has smart device  ie smartphone and/or ipad for downloading an app  Yes 2. Reliable internet/wifi service    Yes 3. Understands how to use their smartphone and navigate within an app.  Yes Pt verbalized understanding and is in agreement. [phone]  [place], ACSM 05/22/2020 11:57 AM

## 2020-05-22 NOTE — Interval H&P Note (Signed)
History and Physical Interval Note:  05/22/2020 9:15 AM  Randy Rodriguez  has presented today for surgery, with the diagnosis of chest pain.  The various methods of treatment have been discussed with the patient and family. After consideration of risks, benefits and other options for treatment, the patient has consented to  Procedure(s): LEFT HEART CATH AND CORONARY ANGIOGRAPHY (N/A) as a surgical intervention.  The patient's history has been reviewed, patient examined, no change in status, stable for surgery.  I have reviewed the patient's chart and labs.  Questions were answered to the patient's satisfaction.    Cath Lab Visit (complete for each Cath Lab visit)  Clinical Evaluation Leading to the Procedure:   ACS: No.  Non-ACS:    Anginal Classification: CCS III  Anti-ischemic medical therapy: Minimal Therapy (1 class of medications)  Non-Invasive Test Results: High-risk stress test findings: cardiac mortality >3%/year (Coronary CTA with significant LAD and OM stenosis by CT FFR)  Prior CABG: No previous CABG        Randy Rodriguez

## 2020-05-22 NOTE — Discharge Instructions (Signed)
DRINK PLENTY OF FLUIDS OVER THE NEXT 2-3 DAYS. Radial Site Care  This sheet gives you information about how to care for yourself after your procedure. Your health care provider may also give you more specific instructions. If you have problems or questions, contact your health care provider. What can I expect after the procedure? After the procedure, it is common to have:  Bruising and tenderness at the catheter insertion area. Follow these instructions at home: Medicines  Take over-the-counter and prescription medicines only as told by your health care provider. Insertion site care  Follow instructions from your health care provider about how to take care of your insertion site. Make sure you: ? Wash your hands with soap and water before you change your bandage (dressing). If soap and water are not available, use hand sanitizer. ? Change your dressing as told by your health care provider. ? Leave stitches (sutures), skin glue, or adhesive strips in place. These skin closures may need to stay in place for 2 weeks or longer. If adhesive strip edges start to loosen and curl up, you may trim the loose edges. Do not remove adhesive strips completely unless your health care provider tells you to do that.  Check your insertion site every day for signs of infection. Check for: ? Redness, swelling, or pain. ? Fluid or blood. ? Pus or a bad smell. ? Warmth.  Do not take baths, swim, or use a hot tub until your health care provider approves.  You may shower 24-48 hours after the procedure, or as directed by your health care provider. ? Remove the dressing and gently wash the site with plain soap and water. ? Pat the area dry with a clean towel. ? Do not rub the site. That could cause bleeding.  Do not apply powder or lotion to the site. Activity   For 24 hours after the procedure, or as directed by your health care provider: ? Do not flex or bend the affected arm. ? Do not push or pull  heavy objects with the affected arm. ? Do not drive yourself home from the hospital or clinic. You may drive 24 hours after the procedure unless your health care provider tells you not to. ? Do not operate machinery or power tools.  Do not lift anything that is heavier than 10 lb (4.5 kg), or the limit that you are told, until your health care provider says that it is safe.  Ask your health care provider when it is okay to: ? Return to work or school. ? Resume usual physical activities or sports. ? Resume sexual activity. General instructions  If the catheter site starts to bleed, raise your arm and put firm pressure on the site. If the bleeding does not stop, get help right away. This is a medical emergency.  If you went home on the same day as your procedure, a responsible adult should be with you for the first 24 hours after you arrive home.  Keep all follow-up visits as told by your health care provider. This is important. Contact a health care provider if:  You have a fever.  You have redness, swelling, or yellow drainage around your insertion site. Get help right away if:  You have unusual pain at the radial site.  The catheter insertion area swells very fast.  The insertion area is bleeding, and the bleeding does not stop when you hold steady pressure on the area.  Your arm or hand becomes pale, cool, tingly,   or numb. These symptoms may represent a serious problem that is an emergency. Do not wait to see if the symptoms will go away. Get medical help right away. Call your local emergency services (911 in the [place] Do not drive yourself to the hospital. Summary  After the procedure, it is common to have bruising and tenderness at the site.  Follow instructions from your health care provider about how to take care of your radial site wound. Check the wound every day for signs of infection.  Do not lift anything that is heavier than 10 lb (4.5 kg), or the limit that you are  told, until your health care provider says that it is safe. This information is not intended to replace advice given to you by your health care provider. Make sure you discuss any questions you have with your health care provider. Document Revised: 07/14/2017 Document Reviewed: 07/14/2017 Elsevier Patient Education  2020 [employer]  

## 2020-05-22 NOTE — Discharge Summary (Signed)
Discharge Summary for Same Day PCI   Patient ID: Randy Rodriguez MRN: [id number]; DOB: [birth year]  Admit date: 05/22/2020 Discharge date: 05/22/2020  Primary Care Provider: [name], MD  Primary Cardiologist: [name], MD  Primary Electrophysiologist:  None   Discharge Diagnoses    Principal Problem:   Coronary artery disease involving native coronary artery of native heart with unstable angina pectoris [facility]) Active Problems:   Hyperlipidemia with target LDL less than 100   Essential hypertension    Diagnostic Studies/Procedures    Cardiac Catheterization 05/22/2020:   Prox RCA lesion is 20% stenosed.  2nd Mrg lesion is 40% stenosed.  Mid LAD lesion is 95% stenosed.  Dist LAD lesion is 80% stenosed.  A drug-eluting stent was successfully placed using a SYNERGY XD 2.25X24.  Post intervention, there is a 0% residual stenosis.   1. Severe stenosis mid LAD. Severe stenosis in the very apical, small caliber distal LAD 2. Mild to moderate stenosis in the first obtuse marginal branch 3. Small non-dominant RCA 4. Successful PTCA/DES x 1 mid LAD  Recommendations: Will continue DAPT with ASA and Plavix for at least six months. Will change Prilosec to Protonix since we will be starting Plavix. Hold metformin 48 hours post cath.   Diagnostic Dominance: Left  Intervention    _____________   History of Present Illness     Randy Rodriguez is a 64 y.o. male with a hx of DMII, HLD, BPH, and GERD who was referred by Dr. Ronnald Ramp for evaluation of decreased exercise capacity and SOB with exertion.  Patient was recently seen by Dr. Ronnald Ramp where he stated that he used to be able to push a lawn mower for about 3 hours but now had to stop to take breaks due to fatigue and SOB. Myoview was performed in July which noted a small partially reversible apical/apical septal defect (artifact vs scar with possible peri-infarct ischemia). EF on myoview 52%. Coronary calcium score 149  (64% for age-sex matched controls).  In the office, the patient stated that he would get more short of breath when walking up the stairs. Has been worsening over the past 1.5 years. No chest pressure or pain when this occurs. Has some associated lightheadedness but no syncope. No palpitations. Had some pedal edema. No orthopnea, or PND.   Family history: Father-CHF at 72; Mom: HTN and CVA; Sister-healthy.  Cardiac catheterization was arranged for further evaluation.  Hospital Course     The patient underwent cardiac cath as noted above with PCI/DESx1 to the mLAD along with severe stenosis in the apical small caliber LAD to be managed medically. Plan for DAPT with ASA/plavix for at least 6 months. The patient was seen by cardiac rehab while in short stay. There were no observed complications post cath. Radial cath site was re-evaluated prior to discharge and found to be stable without any complications. Instructions/precautions regarding cath site care were given prior to discharge. His statin was increased to atrovastatin 35m and metoprolol 272mBID added. Prilosec was switched to Protonix.   Randy Schnitkeras seen by Dr. VaIrish Lacknd determined stable for discharge home. Follow up with our office has been arranged. Medications are listed below. Pertinent changes include addition of plavix, ASA, statin increase and BB.  _____________  Cath/PCI Registry Performance & Quality Measures: 1. Aspirin prescribed? - Yes 2. ADP Receptor Inhibitor (Plavix/Clopidogrel, Brilinta/Ticagrelor or Effient/Prasugrel) prescribed (includes medically managed patients)? - Yes 3. High Intensity Statin (Lipitor 40-8070mr Crestor 20-50m62mrescribed? - Yes 4.  For EF <40%, was ACEI/ARB prescribed? - Not Applicable (EF >/= 13%) 5. For EF <40%, Aldosterone Antagonist (Spironolactone or Eplerenone) prescribed? - Not Applicable (EF >/= 24%) 6. Cardiac Rehab Phase II ordered (Included Medically managed Patients)? -  Yes  _____________   Discharge Vitals Blood pressure (!) 151/95, pulse 80, temperature 97.6 F (36.4 C), temperature source Oral, height 6' 2"  (1.88 m), weight 104.3 kg, SpO2 96 %.  Filed Weights   05/22/20 0805  Weight: 104.3 kg    Last Labs & Radiologic Studies    CBC Recent Labs    05/21/20 1133  WBC 6.6  HGB 15.8  HCT 46.0  MCV 89  PLT 401   Basic Metabolic Panel Recent Labs    05/21/20 1133  NA 137  K 4.3  CL 101  CO2 25  GLUCOSE 123*  BUN 14  CREATININE 0.81  CALCIUM 10.1   Liver Function Tests No results for input(s): AST, ALT, ALKPHOS, BILITOT, PROT, ALBUMIN in the last 72 hours. No results for input(s): LIPASE, AMYLASE in the last 72 hours. High Sensitivity Troponin:   No results for input(s): TROPONINIHS in the last 720 hours.  BNP Invalid input(s): POCBNP D-Dimer No results for input(s): DDIMER in the last 72 hours. Hemoglobin A1C No results for input(s): HGBA1C in the last 72 hours. Fasting Lipid Panel No results for input(s): CHOL, HDL, LDLCALC, TRIG, CHOLHDL, LDLDIRECT in the last 72 hours. Thyroid Function Tests No results for input(s): TSH, T4TOTAL, T3FREE, THYROIDAB in the last 72 hours.  Invalid input(s): FREET3 _____________  CARDIAC CATHETERIZATION  Result Date: 05/22/2020  Prox RCA lesion is 20% stenosed.  2nd Mrg lesion is 40% stenosed.  Mid LAD lesion is 95% stenosed.  Dist LAD lesion is 80% stenosed.  A drug-eluting stent was successfully placed using a SYNERGY XD 2.25X24.  Post intervention, there is a 0% residual stenosis.  1. Severe stenosis mid LAD. Severe stenosis in the very apical, small caliber distal LAD 2. Mild to moderate stenosis in the first obtuse marginal branch 3. Small non-dominant RCA 4. Successful PTCA/DES x 1 mid LAD Recommendations: Will continue DAPT with ASA and Plavix for at least six months. Will change Prilosec to Protonix since we will be starting Plavix. Hold metformin 48 hours post cath.   CT  CORONARY MORPH W/CTA COR W/SCORE W/CA W/CM &/OR WO/CM  Addendum Date: 05/08/2020   ADDENDUM REPORT: 05/08/2020 17:35 CLINICAL DATA:  64 year old male with chest pain. EXAM: Cardiac/Coronary  CTA TECHNIQUE: The patient was scanned on a [employer]. FINDINGS: A 110 kV prospective scan was triggered in the descending thoracic aorta at 111 HU's. Axial non-contrast 3 mm slices were carried out through the heart. The data set was analyzed on a dedicated work station and scored using the Boundary. Gantry rotation speed was 250 msecs and collimation was .6 mm. Beta blockade and 0.8 mg of sl NTG was given. The 3D data set was reconstructed in 5% intervals of the 67-82 % of the R-R cycle. Diastolic phases were analyzed on a dedicated work station using MPR, MIP and VRT modes. The patient received 80 cc of contrast. Aorta: Normal size. Mild descending calcifications. No dissection. Aortic Valve:  Trileaflet.  No calcifications. Coronary Arteries:  Normal coronary origin.  Left dominance. RCA is a small non dominant artery with no plaque. Left main is a large artery that gives rise to LAD and LCX arteries. LAD is a large vessel that has scattered calcified proximal to mid  plaque that appears non flow limiting (0-24%). There is a short segment, 4 mm, of lack of contrast filling in the distal LAD after the small third diagonal branch. This may represent a short segment of severe stenosis. Will send for FFR for clarification. LCX is a dominant artery that gives rise to 2 large OM branches, then eventual PDA. There is proximal AV groove circumflex plaque, 0-24%. There is possible severe stenosis in proximal first OM branch. Other findings: Normal pulmonary vein drainage into the left atrium. Normal left atrial appendage without a thrombus. Normal size of the pulmonary artery. Hiatal hernia Please see radiology report for non cardiac findings. IMPRESSION: 1. Coronary calcium score of 144. This was 81 percentile  for age and sex matched control. 2. Normal coronary origin with right dominance. 3. There is both LAD and circumflex calcified plaque, mostly 0-24%, with two areas of concern: 1. Distal LAD after 3rd diagonal (contrast drop out may represent severe stenosis). 2. Proximal first OM possible severe stenosis. Sending for [facility] for further clarification. 4. Aortic atherosclerosis. 5.  Small hiatal hernia Candee Furbish, MD [facility] Electronically Signed   By: Candee Furbish MD   On: 05/08/2020 17:35   Result Date: 05/08/2020 EXAM: OVER-READ INTERPRETATION  CT CHEST The following report is an over-read performed by radiologist Dr. Abigail Miyamoto of [facility], [place] on 05/08/2020. This over-read does not include interpretation of cardiac or coronary anatomy or pathology. The coronary CTA interpretation by the cardiologist is attached. COMPARISON:  01/01/2020 calcium score CT FINDINGS: Vascular: Normal aortic caliber. No central pulmonary embolism, on this non-dedicated study. Mediastinum/Nodes: No imaged thoracic adenopathy. Small hiatal hernia. Fluid level in the herniated stomach and distal esophagus. Lungs/Pleura: No pleural fluid. Minimal nodularity along the right minor fissure is unchanged on 12/13, likely due to subpleural lymph nodes. Upper Abdomen: Hepatic steatosis. Normal imaged portions of the spleen. Musculoskeletal: No acute osseous abnormality. IMPRESSION: 1. No acute findings in the imaged extracardiac chest. 2. Small hiatal hernia. Esophageal air fluid level suggests dysmotility or gastroesophageal reflux. 3. Hepatic steatosis. Electronically Signed: By: Abigail Miyamoto M.D. On: 05/08/2020 15:59   CT CORONARY FRACTIONAL FLOW RESERVE DATA PREP  Result Date: 05/09/2020 EXAM: FFRCT ANALYSIS 64 year old with abnormal coronary CT FINDINGS: FFRct analysis was performed on the original cardiac CT angiogram dataset. Diagrammatic representation of the FFRct analysis is provided in a separate PDF document in PACS.  This dictation was created using the PDF document and an interactive 3D model of the results. 3D model is not available in the EMR/PACS. Normal FFR range is >0.80. Left main: 0.99 LAD: Distal after small 3rd diagonal: 0.58 (abnormal) Circ: OM1: Proximal 0.75 (abnormal) Non dominant RCA: 0.88 IMPRESSION: 1. Flow limiting disease in the distal LAD after 3rd diagonal in vessel that continues to wrap around apex and supply portion of inferior wall (note left dominant system). 2.  Flow limiting disease in moderate sized first OM branch. In addition to secondary risk factor prevention, consider cardiac catheterization. Candee Furbish, MD [facility] Note: These examples are not recommendations of [employer] and only provided as examples of what other customers are doing. Electronically Signed   By: Candee Furbish MD   On: 05/09/2020 06:54   ECHOCARDIOGRAM COMPLETE  Result Date: 05/21/2020    ECHOCARDIOGRAM REPORT   Patient Name:   Randy Rodriguez    Date of Exam: 05/21/2020 Medical Rec #:  [id number]     Height:       74.0 in Accession #:  [id number]    Weight:       234.0 lb Date of Birth:  02-26-1956     BSA:          2.323 m Patient Age:    64 years      BP:           128/86 mmHg Patient Gender: M             HR:           63 bpm. Exam Location:  [facility] Procedure: 2D Echo, Cardiac Doppler, Color Doppler and Intracardiac            Opacification Agent Indications:    R06.00 Dyspnea  History:        Patient has no prior history of Echocardiogram examinations.                 Risk Factors:Diabetes and Dyslipidemia.  Sonographer:    Cresenciano Lick RDCS Referring Phys: [id number] [place]  1. Left ventricular ejection fraction, by estimation, is 60 to 65%. The left ventricle has normal function. The left ventricle has no regional wall motion abnormalities. Left ventricular diastolic parameters are consistent with Grade I diastolic dysfunction (impaired relaxation).  2. Right ventricular systolic  function is normal. The right ventricular size is normal.  3. The mitral valve is normal in structure. Trivial mitral valve regurgitation. No evidence of mitral stenosis.  4. The aortic valve is normal in structure. Aortic valve regurgitation is not visualized. Mild aortic valve sclerosis is present, with no evidence of aortic valve stenosis.  5. The inferior vena cava is normal in size with greater than 50% respiratory variability, suggesting right atrial pressure of 3 mmHg. FINDINGS  Left Ventricle: Left ventricular ejection fraction, by estimation, is 60 to 65%. The left ventricle has normal function. The left ventricle has no regional wall motion abnormalities. Definity contrast agent was given IV to delineate the left ventricular  endocardial borders. The left ventricular internal cavity size was normal in size. There is no left ventricular hypertrophy. Left ventricular diastolic parameters are consistent with Grade I diastolic dysfunction (impaired relaxation). Normal left ventricular filling pressure. Right Ventricle: The right ventricular size is normal. No increase in right ventricular wall thickness. Right ventricular systolic function is normal. Left Atrium: Left atrial size was normal in size. Right Atrium: Right atrial size was normal in size. Pericardium: There is no evidence of pericardial effusion. Mitral Valve: The mitral valve is normal in structure. Trivial mitral valve regurgitation. No evidence of mitral valve stenosis. Tricuspid Valve: The tricuspid valve is normal in structure. Tricuspid valve regurgitation is not demonstrated. No evidence of tricuspid stenosis. Aortic Valve: The aortic valve is normal in structure. Aortic valve regurgitation is not visualized. Mild aortic valve sclerosis is present, with no evidence of aortic valve stenosis. Pulmonic Valve: The pulmonic valve was normal in structure. Pulmonic valve regurgitation is not visualized. No evidence of pulmonic stenosis. Aorta: The  aortic root is normal in size and structure. Venous: The inferior vena cava is normal in size with greater than 50% respiratory variability, suggesting right atrial pressure of 3 mmHg. IAS/Shunts: No atrial level shunt detected by color flow Doppler.  LEFT VENTRICLE PLAX 2D LVIDd:         4.40 cm  Diastology LVIDs:         2.70 cm  LV e' medial:    6.74 cm/s LV PW:         0.90 cm  LV E/e' medial:  9.5 LV IVS:        0.90 cm  LV e' lateral:   9.14 cm/s LVOT diam:     2.10 cm  LV E/e' lateral: 7.0 LV SV:         64 LV SV Index:   27 LVOT Area:     3.46 cm  RIGHT VENTRICLE RV Basal diam:  3.30 cm RV S prime:     11.00 cm/s TAPSE (M-mode): 1.6 cm LEFT ATRIUM             Index       RIGHT ATRIUM          Index LA diam:        3.60 cm 1.55 cm/m  RA Area:     8.56 cm LA Vol (A2C):   26.2 ml 11.28 ml/m RA Volume:   16.80 ml 7.23 ml/m LA Vol (A4C):   28.9 ml 12.44 ml/m LA Biplane Vol: 27.7 ml 11.92 ml/m  AORTIC VALVE LVOT Vmax:   84.40 cm/s LVOT Vmean:  59.300 cm/s LVOT VTI:    0.184 m  AORTA Ao Root diam: 3.60 cm Ao Asc diam:  3.40 cm MITRAL VALVE               TRICUSPID VALVE MV Area (PHT): 2.80 cm    TR Peak grad:   20.2 mmHg MV Decel Time: 271 msec    TR Vmax:        225.00 cm/s MV E velocity: 64.30 cm/s MV A velocity: 76.30 cm/s  SHUNTS MV E/A ratio:  0.84        Systemic VTI:  0.18 m                            Systemic Diam: 2.10 cm Ena Dawley MD Electronically signed by Ena Dawley MD Signature Date/Time: 05/21/2020/9:51:28 PM    Final     Disposition   Pt is being discharged home today in good condition.  Follow-up Plans & Appointments     Follow-up Information    Charlie Pitter, PA-C Follow up on 05/30/2020.   Specialties: Cardiology, Radiology Why: at 9:15am for your follow up appt Contact information: [address] [place] [address] [phone]              Discharge Instructions    AMB Referral to [facility] Pharm-D   Complete by: As directed     Lipid management and consideration for SGTL2. Has taken atorvastatin 32m/day since 11/2019   Reason For Referral: Lipids   Amb Referral to Cardiac Rehabilitation   Complete by: As directed    Diagnosis:  Coronary Stents PTCA     After initial evaluation and assessments completed: Virtual Based Care may be provided alone or in conjunction with Phase 2 Cardiac Rehab based on patient barriers.: Yes     Discharge Medications   Allergies as of 05/22/2020   No Known Allergies     Medication List    STOP taking these medications   omeprazole 20 MG capsule Commonly known as: PRILOSEC   Rybelsus 7 MG Tabs Generic drug: Semaglutide     TAKE these medications   aspirin EC 81 MG tablet Take 1 tablet (81 mg total) by mouth daily.   atorvastatin 40 MG tablet Commonly known as: LIPITOR Take 2 tablets (80 mg total) by mouth daily. What changed: how much to take  clopidogrel 75 MG tablet Commonly known as: Plavix Take 1 tablet (75 mg total) by mouth daily.   Co Q-10 100 MG Caps Take 300 mg by mouth daily.   glucose blood test strip Use BID   metFORMIN 750 MG 24 hr tablet Commonly known as: Glucophage XR Take 2 tablets (1,500 mg total) by mouth daily with breakfast. Start taking on: May 24, 2020 What changed: These instructions start on May 24, 2020. If you are unsure what to do until then, ask your doctor or other care provider.   metoprolol tartrate 25 MG tablet Commonly known as: LOPRESSOR Take 1 tablet (25 mg total) by mouth 2 (two) times daily.   olmesartan 20 MG tablet Commonly known as: BENICAR Take 1 tablet (20 mg total) by mouth daily.   OneTouch Verio IQ System w/Device Kit 1 Act by Does not apply route 2 (two) times daily.   pantoprazole 40 MG tablet Commonly known as: Protonix Take 1 tablet (40 mg total) by mouth daily.        Allergies No Known Allergies  Outstanding Labs/Studies   FLP/LFTs in 8 weeks  Duration of Discharge Encounter    Greater than 30 minutes including physician time.  Signed, Reino Bellis, NP 05/22/2020, 1:26 PM

## 2020-05-23 ENCOUNTER — Encounter (HOSPITAL_COMMUNITY): Payer: Self-pay | Admitting: Cardiovascular Disease

## 2020-05-23 ENCOUNTER — Other Ambulatory Visit: Payer: Self-pay

## 2020-05-23 ENCOUNTER — Ambulatory Visit (INDEPENDENT_AMBULATORY_CARE_PROVIDER_SITE_OTHER): Payer: PRIVATE HEALTH INSURANCE | Admitting: Pharmacist

## 2020-05-23 DIAGNOSIS — E785 Hyperlipidemia, unspecified: Secondary | ICD-10-CM | POA: Diagnosis not present

## 2020-05-23 MED ORDER — ATORVASTATIN CALCIUM 80 MG PO TABS: 80.0000 mg | ORAL_TABLET | Freq: Every day | ORAL | 3 refills | Status: AC

## 2020-05-23 MED FILL — Heparin Sod (Porcine)-NaCl IV Soln 1000 Unit/500ML-0.9%: INTRAVENOUS | Qty: 1000 | Status: AC

## 2020-05-23 NOTE — Progress Notes (Signed)
Same Day PCI D/C Phone Call   [x]   1st call         []   Answer.         [x]   No Answer    []   2nd call        []   Answer       []   No Answer   Is there bleeding or other abnormality from access site?  []   Yes    []   No  Has there been any chest pain, nausea or vomiting?    []   No  []   Yes chest pain   []   Yes nausea or vomiting  Have you sought any medical attention since discharge?    []   Yes  []   No  Remind Patient about their follow up appointment, review discharge & medication instructions.   []   Yes  []   No  Are you continuing to take you daily ASA & antiplatlet inhibitor(Plavix, Effient, Brilinitia)?  []   Yes  []   No  Would you say you were satisfied with you Same Day Discharge?  (Disagree, Neutral, Agree)   []   Disagree, why  []   Neutral  []   Agree

## 2020-05-23 NOTE — Progress Notes (Signed)
Patient ID: Randy Rodriguez                 DOB: 1956/06/17                    MRN: [id number]     HPI: Randy Rodriguez is a 64 y.o. male patient referred to lipid clinic by Dr. Johney Rodriguez. PMH is significant for DMII, HLD, BPH, and GERD. Myoview was performed in July which noted a small partially reversible apical/apical septal defect (artifact vs scar with possible peri-infarct ischemia). EF on myoview 52%. Coronary calcium score 149 (64% for age-sex matched controls). He had a cardiac CT done and ultimately a cardiac cath with stent placement on 11/30. He was resumed on atorvastatin on 04/24/20. Increased to 88m yesterday. No repeat lab available, but I suspect this will not get him to goal of <55.  Patient presents to lipid clinic today in good spirits. He is trying to improve diet. Walks about 5000-6000 steps per day at work but does not do any other exercise. Has a treadmill and bike but hasn't used in awhile. Is interested in virtual cardiac rehab. Plans to retire in Feb. Currently has cPharmacist, community  Current Medications: atorvastatin 88mdaily Intolerances: none Risk Factors: CAD, DM  LDL goal: <55  Diet: breakfast: sausage, egg, cheese bisquett Lunch: hard boiled egg, cheese stick, tuKuwaitandwich w/ keto bread Dinner: fish, spaghetti Eats out twice a week Snacks: cheese stick, cookies w/ milk Drinks: more water lately, un-sweet tea, occassional diet soda Beer here and there   Exercise:   Family History: The patient's family history includes Hypertension in his mother; Prostate cancer in his father; Stomach cancer in his maternal grandfather; Stroke in his mother. There is no history of Colon cancer, Esophageal cancer, Cancer, Alcohol abuse, Diabetes, Drug abuse, Early death, Heart disease, Hyperlipidemia, Kidney disease, Colon polyps, or Rectal cancer.  Social History: never smokes, 2 beers per week  Labs: 12/13/19 TC 220 TG 208 HDL 39.6 NonHDL 180 Direct LDL 153 (no meds)  Past  Medical History:  Diagnosis Date   BPH (benign prostatic hyperplasia) 2012   biopsy this week with ? MD at [facility]   Diabetes mellitus    GERD (gastroesophageal reflux disease)    Hyperlipidemia     Current Outpatient Medications on File Prior to Visit  Medication Sig Dispense Refill   aspirin EC 81 MG tablet Take 1 tablet (81 mg total) by mouth daily. 90 tablet 1   atorvastatin (LIPITOR) 40 MG tablet Take 2 tablets (80 mg total) by mouth daily. 90 tablet 1   Blood Glucose Monitoring Suppl (ONETOUCH VERIO IQ SYSTEM) w/Device KIT 1 Act by Does not apply route 2 (two) times daily. 1 kit 2   clopidogrel (PLAVIX) 75 MG tablet Take 1 tablet (75 mg total) by mouth daily. 90 tablet 3   Coenzyme Q10 (CO Q-10) 100 MG CAPS Take 300 mg by mouth daily.     glucose blood test strip Use BID 100 each 11   [START ON 05/24/2020] metFORMIN (GLUCOPHAGE XR) 750 MG 24 hr tablet Take 2 tablets (1,500 mg total) by mouth daily with breakfast. 180 tablet 1   metoprolol tartrate (LOPRESSOR) 25 MG tablet Take 1 tablet (25 mg total) by mouth 2 (two) times daily. 60 tablet 1   olmesartan (BENICAR) 20 MG tablet Take 1 tablet (20 mg total) by mouth daily. 90 tablet 1   pantoprazole (PROTONIX) 40 MG tablet Take 1 tablet (40 mg total)  by mouth daily. 90 tablet 3   No current facility-administered medications on file prior to visit.    No Known Allergies  Assessment/Plan:  1. Hyperlipidemia - LDL was above goal of <55 on last labs. These labs were from June without any medication. He is now on atorvastatin 34m daily. Tolerating fine. I expect to see a >50% reduction in LDL. However, he may still be short of his goal of <55. We discussed all his add on options including zetia, PCSK9i and inclisirn (when FDA approved). We discussed each cardiovascular risk reduction data and LDL lowering ability. Will check lipid panel in 1 month 06/20/20 and then discuss options further at that time if LDL is not  <55. I have encouraged him to participate in cardiac rehab and increase exercise at home. He knows to start slow and increase as tolerated. Was provided info at hospital as well.  Thank you,   Randy Rodriguez Pharm.D, BCPS, CPP [place] [address] [place] [address] Phone: [phone] Fax: [phone]

## 2020-05-23 NOTE — Patient Instructions (Signed)
Please come for labs on 06/20/20. You can come anytime between 7:30-4:30. Please be fasting.  I will call you with your lab results and we can discuss what we need to do from there.  Options would include: Zetia Repatha or Praluent Inclisirin  Please call me with any questions [phone]

## 2020-05-28 ENCOUNTER — Telehealth (HOSPITAL_COMMUNITY): Payer: Self-pay

## 2020-05-28 NOTE — Telephone Encounter (Signed)
Attempted to call patient in regards to Cardiac Rehab - LM on VM 

## 2020-05-29 NOTE — Progress Notes (Signed)
Cardiology Office Note   Date:  05/30/2020   ID:  Randy Rodriguez, DOB 16-May-1956, MRN [id number]  PCP:  [name], MD  Cardiologist: Dr. Gwyndolyn Kaufman, MD  Chief Complaint  Patient presents with  . Follow-up  . Hospitalization Follow-up    History of Present Illness: Randy Rodriguez is a 64 y.o. male who presents for post same day PCI follow-up, seen by Dr. Johney Frame.  Randy Rodriguez has a hx of DMII, HLD, BPH, and GERD who was referred by Dr. Ronnald Ramp for evaluation of DOE.   He was recently seen by Dr. 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 Myoview was performed in 12/2019 which noted a small partially reversible apical/apical septal defect (artifact vs scar with possible peri-infarct ischemia). EF on myoview 52%. Coronary calcium score was 149 (64% for age-sex matched controls).  Given his symptoms and CV risk factors plan for to pursue LHC with PCI/DESx1 to the mLAD along with severe stenosis in the apical small caliber LAD to be managed medically. Plan for DAPT with ASA/plavix for at least 6 months. His statin was increased to atrovastatin 19m and metoprolol 211mBID added. Prilosec was switched to Protonix.   Today he presents for follow-up and states that he is doing well from a CV standpoint.  He denies recurrent anginal symptoms.  He states that his DOE has improved.  Cath site is stable without signs or symptoms of hematoma.  He is tolerating his medications well.  He is interested in participating in cardiac rehabilitation which seems appropriate.  He reports he is retiring in February and is asking about the [place].  We can assist in recommendations for this.  He is already established with the lipid clinic for which he has follow-up lipid panel, LFTs 06/20/2020.  He is open for Repatha therapy if poor response  to high intensity statin.   Past Medical History:  Diagnosis Date  . BPH (benign prostatic hyperplasia) 2012   biopsy this week with ? MD at [facility]. Diabetes mellitus   . GERD (gastroesophageal reflux disease)   . Hyperlipidemia     Past Surgical History:  Procedure Laterality Date  . COLONOSCOPY    . CORONARY STENT INTERVENTION N/A 05/22/2020   Procedure: CORONARY STENT INTERVENTION;  Surgeon: [name];  [facility];  Service: Cardiovascular;  Laterality: N/A;  . LEFT HEART CATH AND CORONARY ANGIOGRAPHY N/A 05/22/2020   Procedure: LEFT HEART CATH AND CORONARY ANGIOGRAPHY;  Surgeon: [name];  Location[facility];  Service: Cardiovascular;  Laterality: N/A;  . POLYPECTOMY    . TONSILLECTOMY  1964  . UPPER GASTROINTESTINAL ENDOSCOPY       Current Outpatient Medications  Medication Sig Dispense Refill  . aspirin EC 81 MG tablet Take 1 tablet (81 mg total) by mouth daily. 90 tablet 1  . atorvastatin (LIPITOR) 80 MG tablet Take 1 tablet (80 mg total) by mouth daily. 90 tablet 3  . Blood Glucose Monitoring Suppl (ONETOUCH VERIO IQ SYSTEM) w/Device KIT 1 Act by Does not apply route 2 (two) times daily. 1 kit 2  . clopidogrel (PLAVIX) 75 MG tablet Take 1 tablet (75 mg total) by mouth daily. 90 tablet 3  . Coenzyme Q10 (  CO Q-10) 100 MG CAPS Take 300 mg by mouth daily.    Marland Kitchen glucose blood test strip Use BID 100 each 11  . metFORMIN (GLUCOPHAGE XR) 750 MG 24 hr tablet Take 2 tablets (1,500 mg total) by mouth daily with breakfast. 180 tablet 1  . metoprolol tartrate (LOPRESSOR) 25 MG tablet Take 1 tablet (25 mg total) by mouth 2 (two) times daily. 60 tablet 1  . olmesartan (BENICAR) 20 MG tablet Take 1 tablet (20 mg total) by mouth daily. 90 tablet 1  . pantoprazole (PROTONIX) 40 MG tablet Take 1 tablet (40 mg total) by mouth daily. 90 tablet 3   No current facility-administered medications for this visit.     Allergies:   Patient has no known allergies.    Social History:  The patient  reports that he has never smoked. He has never used smokeless tobacco. He reports current alcohol use of about 2.0 standard drinks of alcohol per week. He reports that he does not use drugs.   Family History:  The patient's family history includes Hypertension in his mother; Prostate cancer in his father; Stomach cancer in his maternal grandfather; Stroke in his mother.    ROS:  Please see the history of present illness.   Otherwise, review of systems are positive for none. All other systems are reviewed and negative.    PHYSICAL EXAM: VS:  BP 110/70   Pulse 63   Ht 6' 2"  (1.88 m)   Wt 229 lb (103.9 kg)   SpO2 96%   BMI 29.40 kg/m  , BMI Body mass index is 29.4 kg/m.   General: Well developed, well nourished, NAD Lungs:Clear to ausculation bilaterally. Breathing is unlabored. Cardiovascular: RRR with S1 S2. No murmurs Extremities: No edema. Neuro: Alert and oriented. No focal deficits. No facial asymmetry. MAE spontaneously. Psych: Responds to questions appropriately with normal affect.     EKG:  EKG is ordered today. The ekg ordered today demonstrates NSR with no change from prior tracing    Recent Labs: 12/13/2019: ALT 18; TSH 1.42 05/21/2020: BUN 14; Creatinine, Ser 0.81; Hemoglobin 15.8; Platelets 197; Potassium 4.3; Sodium 137    Lipid Panel    Component Value Date/Time   CHOL 220 (H) 12/13/2019 1629   TRIG 208.0 (H) 12/13/2019 1629   HDL 39.60 12/13/2019 1629   CHOLHDL 6 12/13/2019 1629   VLDL 41.6 (H) 12/13/2019 1629   LDLCALC 96 04/05/2017 1706   LDLDIRECT 153.0 12/13/2019 1629      Wt Readings from Last 3 Encounters:  05/30/20 229 lb (103.9 kg)  05/22/20 230 lb (104.3 kg)  04/24/20 234 lb (106.1 kg)    Other studies Reviewed: Additional studies/ records that were reviewed today include:  Review of the above records demonstrates:   Cardiac Catheterization  05/22/2020:   Prox RCA lesion is 20% stenosed.  2nd Mrg lesion is 40% stenosed.  Mid LAD lesion is 95% stenosed.  Dist LAD lesion is 80% stenosed.  A drug-eluting stent was successfully placed using a SYNERGY XD 2.25X24.  Post intervention, there is a 0% residual stenosis.  1. Severe stenosis mid LAD. Severe stenosis in the very apical, small caliber distal LAD 2. Mild to moderate stenosis in the first obtuse marginal branch 3. Small non-dominant RCA 4. Successful PTCA/DES x 1 mid LAD  Recommendations: Will continue DAPT with ASA and Plavix for at least six months. Will change Prilosec to Protonix since we will be starting Plavix. Hold metformin 48 hours post cath.  Diagnostic Dominance: Left  Intervention       ASSESSMENT AND PLAN:  1.  CAD s/p PCI/DES x1 to the mid LAD: -Initially presented 04/24/2020 with complaints of DOE with known coronary calcium score of 162.  Coronary CTA performed which was found to be abnormal therefore LHC pursued 05/22/2020 with PCI/DES placement to mid LAD for severe stenosis. -DAPT with ASA and Plavix for at least 6 months -Atorvastatin increased to 80 mg p.o. daily and patient placed on metoprolol 25 mg twice daily -Reports improved exercise tolerance -Appropriate for cardiac rehab  2.  Mixed HLD: -LDL recently found to be markedly elevated at 153 with LDL goal of less than 70>> seen by lipid clinic with plans for follow-up lab work 06/20/2020 -Atorvastatin increased to 80 mg daily at hospital discharge -Continue Vascepa  3.  DM2: -Hemoglobin A1c, 7.7 -Continue Metformin, statin, semaglutide and olmesartan per PCP  4.  Elevated creatinine: -Found on prior lab work however appears to be stabilized on most recent lab work from 04/24/2020 and 05/21/2020 with a baseline which appears to be in the 0.9 range. -Repeat lab work today given contrast dye for LHC   Current medicines are reviewed at length with the patient today.  The  patient does not have concerns regarding medicines.  The following changes have been made:  no change  Labs/ tests ordered today include: BMET  Orders Placed This Encounter  Procedures  . Basic metabolic panel  . EKG 12-Lead   Disposition:   FU with Dr. Johney Frame or myself in 8 weeks  Signed, Kathyrn Drown, NP  05/30/2020 9:42 AM    [address], [place], [place]  [address] Phone: [phone]; Fax: [phone]

## 2020-05-30 ENCOUNTER — Ambulatory Visit: Payer: PRIVATE HEALTH INSURANCE | Admitting: Physician Assistant

## 2020-05-30 ENCOUNTER — Encounter: Payer: Self-pay | Admitting: Cardiology

## 2020-05-30 ENCOUNTER — Ambulatory Visit (INDEPENDENT_AMBULATORY_CARE_PROVIDER_SITE_OTHER): Payer: PRIVATE HEALTH INSURANCE | Admitting: Cardiology

## 2020-05-30 ENCOUNTER — Other Ambulatory Visit: Payer: Self-pay

## 2020-05-30 VITALS — BP 110/70 | HR 63 | Ht 74.0 in | Wt 229.0 lb

## 2020-05-30 DIAGNOSIS — I2510 Atherosclerotic heart disease of native coronary artery without angina pectoris: Secondary | ICD-10-CM | POA: Diagnosis not present

## 2020-05-30 DIAGNOSIS — R0602 Shortness of breath: Secondary | ICD-10-CM

## 2020-05-30 DIAGNOSIS — I25110 Atherosclerotic heart disease of native coronary artery with unstable angina pectoris: Secondary | ICD-10-CM | POA: Diagnosis not present

## 2020-05-30 DIAGNOSIS — Z9861 Coronary angioplasty status: Secondary | ICD-10-CM

## 2020-05-30 DIAGNOSIS — E785 Hyperlipidemia, unspecified: Secondary | ICD-10-CM | POA: Diagnosis not present

## 2020-05-30 DIAGNOSIS — E118 Type 2 diabetes mellitus with unspecified complications: Secondary | ICD-10-CM

## 2020-05-30 NOTE — Patient Instructions (Signed)
Medication Instructions:  Your physician recommends that you continue on your current medications as directed. Please refer to the Current Medication list given to you today.  *If you need a refill on your cardiac medications before your next appointment, please call your pharmacy*   Lab Work: TODAY: BMET If you have labs (blood work) drawn today and your tests are completely normal, you will receive your results only by: Marland Kitchen MyChart Message (if you have MyChart) OR . A paper copy in the mail If you have any lab test that is abnormal or we need to change your treatment, we will call you to review the results.   Testing/Procedures: NONE   Follow-Up: At [facility], you and your health needs are our priority.  As part of our continuing mission to provide you with exceptional heart care, we have created designated Provider Care Teams.  These Care Teams include your primary Cardiologist (physician) and Advanced Practice Providers (APPs -  Physician Assistants and Nurse Practitioners) who all work together to provide you with the care you need, when you need it.  We recommend signing up for the patient portal called "MyChart".  Sign up information is provided on this After Visit Summary.  MyChart is used to connect with patients for Virtual Visits (Telemedicine).  Patients are able to view lab/test results, encounter notes, upcoming appointments, etc.  Non-urgent messages can be sent to your provider as well.   To learn more about what you can do with MyChart, go to [email or web address].    Your next appointment:   8 week(s)  The format for your next appointment:   In Person  Provider:   You may see [name], MD or Kathyrn Drown , NP

## 2020-05-31 LAB — BASIC METABOLIC PANEL
BUN/Creatinine Ratio: 17 (ref 10–24)
BUN: 15 mg/dL (ref 8–27)
CO2: 21 mmol/L (ref 20–29)
Calcium: 10.1 mg/dL (ref 8.6–10.2)
Chloride: 100 mmol/L (ref 96–106)
Creatinine, Ser: 0.9 mg/dL (ref 0.76–1.27)
GFR calc Af Amer: 104 mL/min/{1.73_m2} (ref >59)
GFR calc non Af Amer: 90 mL/min/{1.73_m2} (ref >59)
Glucose: 117 mg/dL — ABNORMAL HIGH (ref 65–99)
Potassium: 5.1 mmol/L (ref 3.5–5.2)
Sodium: 137 mmol/L (ref 134–144)

## 2020-06-05 ENCOUNTER — Telehealth (HOSPITAL_COMMUNITY): Payer: Self-pay

## 2020-06-05 ENCOUNTER — Other Ambulatory Visit: Payer: Self-pay | Admitting: Internal Medicine

## 2020-06-05 DIAGNOSIS — I10 Essential (primary) hypertension: Secondary | ICD-10-CM

## 2020-06-05 DIAGNOSIS — E118 Type 2 diabetes mellitus with unspecified complications: Secondary | ICD-10-CM

## 2020-06-05 NOTE — Telephone Encounter (Signed)
Pt called and stated he is not interested in CR at this time due to his work schedule.  Closed referral

## 2020-06-05 NOTE — Telephone Encounter (Signed)
Pharmacy Transitions of Care Follow-up Telephone Call  Date of discharge: 05/22/20 Discharge Diagnosis: coronary artery stent placement  How have you been since you were released from the hospital? Been feeling better. Is being followed by cardiology and pharmacy so he feels good about    Medication changes made at discharge: Yes  Medication changes obtained and verified? Yes    Medication Accessibility:  Home Pharmacy: [facility] on [address] in [place]  Was the patient provided with refills on discharged medications? yes  Have all prescriptions been transferred from [facility] to home pharmacy? yes  Is the patient able to afford medications? yes    Medication Review:  CLOPIDOGREL (PLAVIX) Clopidogrel 75 mg once daily.  - Educated patient on expected duration of therapy of at least 12 months with clopidogrel. Advised patient that aspirin will be continued indefinitely.  - Reviewed potential DDIs with patient  - Advised patient of medications to avoid (NSAIDs, ASA)  - Educated that Tylenol (acetaminophen) will be the preferred analgesic to prevent risk of bleeding  - Emphasized importance of monitoring for signs and symptoms of bleeding (abnormal bruising, prolonged bleeding, nose bleeds, bleeding from gums, discolored urine, black tarry stools)  - Advised patient to alert all providers of anticoagulation therapy prior to starting a new medication or having a procedure   Follow-up Appointments:  PCP Hospital f/u appt confirmed?  Patient is now being followed by Cardiology had first f/u appointment on 05/30/20 with D. Glenford Peers at cardiology. Has been in touch with PCP at internal med though.   [facility] f/u appt confirmed? Yes Scheduled to see Dr. Shawn Stall (Cardiology) on 06/20/20.   If their condition worsens, is the pt aware to call PCP or go to the Emergency Dept.? Yes  Final Patient Assessment: Patient is feeling better and is in good spirits. He has refills and  is being followed up with by cardiology.

## 2020-06-20 ENCOUNTER — Other Ambulatory Visit: Payer: PRIVATE HEALTH INSURANCE | Admitting: *Deleted

## 2020-06-20 ENCOUNTER — Other Ambulatory Visit: Payer: Self-pay

## 2020-06-20 DIAGNOSIS — E785 Hyperlipidemia, unspecified: Secondary | ICD-10-CM

## 2020-06-20 LAB — LIPID PANEL
Chol/HDL Ratio: 3.3 ratio (ref 0.0–5.0)
Cholesterol, Total: 117 mg/dL (ref 100–199)
HDL: 35 mg/dL — ABNORMAL LOW (ref >39)
LDL Chol Calc (NIH): 68 mg/dL (ref 0–99)
Triglycerides: 69 mg/dL (ref 0–149)
VLDL Cholesterol Cal: 14 mg/dL (ref 5–40)

## 2020-06-20 LAB — HEPATIC FUNCTION PANEL
ALT: 27 IU/L (ref 0–44)
AST: 21 IU/L (ref 0–40)
Albumin: 4.4 g/dL (ref 3.8–4.8)
Alkaline Phosphatase: 67 IU/L (ref 44–121)
Bilirubin Total: 0.5 mg/dL (ref 0.0–1.2)
Bilirubin, Direct: 0.17 mg/dL (ref 0.00–0.40)
Total Protein: 6.9 g/dL (ref 6.0–8.5)

## 2020-07-16 ENCOUNTER — Other Ambulatory Visit: Payer: Self-pay | Admitting: Cardiology

## 2020-07-23 NOTE — Progress Notes (Signed)
Cardiology Office Note:    Date:  07/25/2020   ID:  Randy Rodriguez, DOB 1955-09-07, MRN [id number]  PCP:  [name], MD  [facility] Cardiologist:  [name], MD  [facility] Electrophysiologist:  None   Referring MD: [name], MD    History of Present Illness:    Randy Rodriguez is a 65 y.o. male with a hx of DMII, HLD, BPH, and GERD and CAD s/p DES to mLAD who presents to clinic for follow-up.   Patient was initially seen by Dr. Ronnald Ramp where he noted to have decreased exercise tolerance and SOB on exertion.  Myoview was performed in July which noted a small partially reversible apical/apical septal defect (artifact vs scar with possible peri-infarct ischemia). EF on myoview 52%. Coronary calcium score 149 (64% for age-sex matched controls). Given persistence of symptoms, he was referred to Cardiology clinic for further evaluation.  During our visit on 04/24/20, the patient continued to complain on DOE and decreased exercise capacity. Given his symptoms, we recommended coronary CTA which revealed flow limiting disease in the distal LAD and OM. He was referred for cath at that time where he was noted to have mid LAD 95%, distal 80% s/p successful PCI to mLAD. Also with 20% prox RCA and 40% OM2 which were recommended for aggressive medical management. TTE with normal BiV function, no significant valvular abnormalities. Planned for DAPT with ASA/plavixfor at least 6 months. His statin was increased to atrovastatin 54m and metoprolol 275mBID added. Prilosec was switched to Protonix.  Today, the patient states that he is overall feeling well with no SOB, chest pain, lightheadedness, dizziness or palpitations. Tolerating aspirin and plavix without bleeding issues. Patient states that he and his wife have made significant dietary efforts and has cut back on sugar and carbohydrates. 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 Blood pressure well controlled.  Taking all medications as prescribed. [place] is [place] at [place] end of [place]    Past Medical History:  Diagnosis Date  . BPH (benign prostatic hyperplasia) 2012   biopsy this week with ? MD at [employer]. Diabetes mellitus   . GERD (gastroesophageal reflux disease)   . Hyperlipidemia     Past Surgical History:  Procedure Laterality Date  . COLONOSCOPY    . CORONARY STENT INTERVENTION N/A 05/22/2020   Procedure: CORONARY STENT INTERVENTION;  Surgeon: [name];  [facility];  Service: Cardiovascular;  Laterality: N/A;  . LEFT HEART CATH AND CORONARY ANGIOGRAPHY N/A 05/22/2020   Procedure: LEFT HEART CATH AND CORONARY ANGIOGRAPHY;  Surgeon: [name];  [facility];  Service: Cardiovascular;  Laterality: N/A;  . POLYPECTOMY    . TONSILLECTOMY  1964  . UPPER GASTROINTESTINAL ENDOSCOPY      Current Medications: Current Meds  Medication Sig  . aspirin EC 81 MG tablet Take 1 tablet (81 mg total) by mouth daily.  . Marland Kitchentorvastatin (LIPITOR) 80 MG tablet Take 1 tablet (80 mg total) by mouth daily.  . Blood Glucose Monitoring Suppl (ONETOUCH VERIO IQ SYSTEM) w/Device KIT 1 Act by Does not apply route 2 (two) times daily.  . clopidogrel (PLAVIX) 75 MG tablet Take 1 tablet (75 mg total) by mouth daily.  . Coenzyme Q10 (CO Q-10) 100 MG CAPS Take 300 mg by mouth daily.  . Marland Kitchenlucose blood test strip Use BID  . metFORMIN (GLUCOPHAGE XR)  750 MG 24 hr tablet Take 2 tablets (1,500 mg total) by mouth daily with breakfast.  . metoprolol succinate (TOPROL XL) 25 MG 24 hr tablet Take 1 tablet (25 mg total) by mouth daily.  Marland Kitchen olmesartan (BENICAR) 20 MG tablet Take 1 tablet by mouth once daily  . pantoprazole (PROTONIX) 40 MG tablet Take 1 tablet (40 mg total) by mouth daily.  . [DISCONTINUED] metoprolol tartrate (LOPRESSOR) 25 MG tablet Take 1 tablet by mouth twice daily     Allergies:   Patient has no known  allergies.   Social History   Socioeconomic History  . Marital status: Married    Spouse name: Not on file  . Number of children: Not on file  . Years of education: Not on file  . Highest education level: Not on file  Occupational History  . Not on file  Tobacco Use  . Smoking status: Never Smoker  . Smokeless tobacco: Never Used  Substance and Sexual Activity  . Alcohol use: Yes    Alcohol/week: 2.0 standard drinks    Types: 2 Cans of beer per week  . Drug use: No  . Sexual activity: Yes  Other Topics Concern  . Not on file  Social History Narrative  . Not on file   Social Determinants of Health   Financial Resource Strain: Not on file  Food Insecurity: Not on file  Transportation Needs: Not on file  Physical Activity: Not on file  Stress: Not on file  Social Connections: Not on file     Family History: The patient's family history includes Hypertension in his mother; Prostate cancer in his father; Stomach cancer in his maternal grandfather; Stroke in his mother. There is no history of Colon cancer, Esophageal cancer, Cancer, Alcohol abuse, Diabetes, Drug abuse, Early death, Heart disease, Hyperlipidemia, Kidney disease, Colon polyps, or Rectal cancer.  ROS:   Please see the history of present illness.    Review of Systems  Constitutional: Negative for chills and fever.  HENT: Negative for nosebleeds.   Eyes: Negative for blurred vision.  Respiratory: Negative for sputum production and shortness of breath.   Cardiovascular: Negative for chest pain, palpitations, orthopnea, claudication, leg swelling and PND.  Gastrointestinal: Negative for melena, nausea and vomiting.  Genitourinary: Negative for hematuria.  Musculoskeletal: Negative for falls.  Neurological: Negative for dizziness and loss of consciousness.  Endo/Heme/Allergies: Negative for polydipsia.  Psychiatric/Behavioral: Negative for substance abuse.    EKGs/Labs/Other Studies Reviewed:    The  following studies were reviewed today: Coronary CTA 05/08/20: IMPRESSION: 1. Flow limiting disease in the distal LAD after 3rd diagonal in vessel that continues to wrap around apex and supply portion of inferior wall (note left dominant system).  2.  Flow limiting disease in moderate sized first OM branch.  TTE 05/21/20: IMPRESSIONS  1. Left ventricular ejection fraction, by estimation, is 60 to 65%. The  left ventricle has normal function. The left ventricle has no regional  wall motion abnormalities. Left ventricular diastolic parameters are  consistent with Grade I diastolic  dysfunction (impaired relaxation).  2. Right ventricular systolic function is normal. The right ventricular  size is normal.  3. The mitral valve is normal in structure. Trivial mitral valve  regurgitation. No evidence of mitral stenosis.  4. The aortic valve is normal in structure. Aortic valve regurgitation is  not visualized. Mild aortic valve sclerosis is present, with no evidence  of aortic valve stenosis.  5. The inferior vena cava is normal in size with  greater than 50%  respiratory variability, suggesting right atrial pressure of 3 mmHg.   Cath 05/23/20:  Prox RCA lesion is 20% stenosed.  2nd Mrg lesion is 40% stenosed.  Mid LAD lesion is 95% stenosed.  Dist LAD lesion is 80% stenosed.  A drug-eluting stent was successfully placed using a SYNERGY XD 2.25X24.  Post intervention, there is a 0% residual stenosis.   1. Severe stenosis mid LAD. Severe stenosis in the very apical, small caliber distal LAD 2. Mild to moderate stenosis in the first obtuse marginal branch 3. Small non-dominant RCA 4. Successful PTCA/DES x 1 mid LAD  Recommendations: Will continue DAPT with ASA and Plavix for at least six months. Will change Prilosec to Protonix since we will be starting Plavix. Hold metformin 48 hours post cath.   Recent Labs: 12/13/2019: TSH 1.42 05/21/2020: Hemoglobin 15.8; Platelets  197 05/30/2020: BUN 15; Creatinine, Ser 0.90; Potassium 5.1; Sodium 137 06/20/2020: ALT 27  Recent Lipid Panel    Component Value Date/Time   CHOL 117 06/20/2020 0722   TRIG 69 06/20/2020 0722   HDL 35 (L) 06/20/2020 0722   CHOLHDL 3.3 06/20/2020 0722   CHOLHDL 6 12/13/2019 1629   VLDL 41.6 (H) 12/13/2019 1629   LDLCALC 68 06/20/2020 0722   LDLDIRECT 153.0 12/13/2019 1629     Physical Exam:    VS:  BP 110/60 Comment: right arm 100/60  Pulse 64   Ht 6' 3"  (1.905 m)   Wt 230 lb 12.8 oz (104.7 kg)   SpO2 96%   BMI 28.85 kg/m     Wt Readings from Last 3 Encounters:  07/25/20 230 lb 12.8 oz (104.7 kg)  05/30/20 229 lb (103.9 kg)  05/22/20 230 lb (104.3 kg)     GEN:  Well nourished, well developed in no acute distress HEENT: Normal NECK: No JVD; No carotid bruits CARDIAC: RRR, no murmurs, rubs, gallops RESPIRATORY:  Clear to auscultation without rales, wheezing or rhonchi  ABDOMEN: Soft, non-tender, non-distended MUSCULOSKELETAL:  No edema; No deformity  SKIN: Warm and dry NEUROLOGIC:  Alert and oriented x 3 PSYCHIATRIC:  Normal affect   ASSESSMENT:    1. Hyperlipidemia with target LDL less than 100   2. CAD S/P percutaneous coronary angioplasty   3. Type II diabetes mellitus with manifestations ([place])   4. Medication management    PLAN:    In order of problems listed above:  #CAD s/p PCI/DES x1 to the mid LAD: The patient nitially presented 04/24/2020 with complaints of DOE with known coronary calcium score of 162.  Coronary CTA performed which was found to have flow limiting lesions in the mid LAD and OM2 and was sent for [facility].  Cath with 95% mid LAD s/p PCI, 80% distal LAD, 20% prox RCA, 40% OM. Currently on ASA and plavix. No anginal symptoms.  -DAPT with ASA and Plavix for 1 year and then aspirin indefinitely -Continue lipitor 83m daily; lipid panel next week -Change metop tartrate to succinate 251mXL daily -Continue olmesartan 2083maily; repeat BMET as K a  little high on last labs -TTE with normal BiV function, no WMA -Counseled about healthy diet and regular exercise as detailed below -[place] LDL recently found to be markedly elevated at 153 with LDL goal of less than 70>> seen by lipid clinic with plans for follow-up lab work 06/20/2020 -Atorvastatin increased to 80 mg daily at hospital discharge -Repeat lipids next week and adjust  medications as needed  #DM2: Managed by PCP -Hemoglobin A1c, 7.7 on last check--will repeat for PCP -Continue Metformin and semaglutide  #HTN: Well controlled. -Continue olmesartan and metop as above   Exercise recommendations: Goal of exercising for at least 30 minutes a day, at least 5 times per week.  Please exercise to a moderate exertion.  This means that while exercising it is difficult to speak in full sentences, however you are not so short of breath that you feel you must stop, and not so comfortable that you can carry on a full conversation.  Exertion level should be approximately a 5/10, if 10 is the most exertion you can perform.  Diet recommendations: Recommend a heart healthy diet such as the Mediterranean diet.  This diet consists of plant based foods, healthy fats, lean meats, olive oil.  It suggests limiting the intake of simple carbohydrates such as white breads, pastries, and pastas.  It also limits the amount of red meat, wine, and dairy products such as cheese that one should consume on a daily basis.  Medication Adjustments/Labs and Tests Ordered: Current medicines are reviewed at length with the patient today.  Concerns regarding medicines are outlined above.  Orders Placed This Encounter  Procedures  . Lipid Profile  . Basic Metabolic Panel (BMET)  . HgB A1c   Meds ordered this encounter  Medications  . metoprolol succinate (TOPROL XL) 25 MG 24 hr tablet    Sig: Take 1 tablet (25 mg total) by mouth daily.     Dispense:  90 tablet    Refill:  3    Patient to stop metoprolol tartrate    Patient Instructions  Medication Instructions:  Your physician has recommended you make the following change in your medication: Stop metoprolol tartrate Start metoprolol succinate 25 mg by mouth daily  *If you need a refill on your cardiac medications before your next appointment, please call your pharmacy*   Lab Work: Your physician recommends that you return for lab work on 08/01/20--BMP, Hemoglobin A1C, lipid profile.  This will be fasting.  The lab opens at 7:30 AM  If you have labs (blood work) drawn today and your tests are completely normal, you will receive your results only by: Marland Kitchen MyChart Message (if you have MyChart) OR . A paper copy in the mail If you have any lab test that is abnormal or we need to change your treatment, we will call you to review the results.   Testing/Procedures: none   Follow-Up: At [facility], you and your health needs are our priority.  As part of our continuing mission to provide you with exceptional heart care, we have created designated Provider Care Teams.  These Care Teams include your primary Cardiologist (physician) and Advanced Practice Providers (APPs -  Physician Assistants and Nurse Practitioners) who all work together to provide you with the care you need, when you need it.  We recommend signing up for the patient portal called "MyChart".  Sign up information is provided on this After Visit Summary.  MyChart is used to connect with patients for Virtual Visits (Telemedicine).  Patients are able to view lab/test results, encounter notes, upcoming appointments, etc.  Non-urgent messages can be sent to your provider as well.   To learn more about what you can do with MyChart, go to [email or web address].    Your next appointment:   6 month(s)  The format for your next appointment:   In Person  Provider:   You may see Nira Conn  Renae Fickle, MD or one of the  following Advanced Practice Providers on your designated Care Team:    Richardson Dopp, PA-C  Robbie Lis, [place]    Other Instructions      Signed, [name], MD  07/25/2020 3:27 PM    [place]

## 2020-07-25 ENCOUNTER — Other Ambulatory Visit: Payer: Self-pay

## 2020-07-25 ENCOUNTER — Ambulatory Visit (INDEPENDENT_AMBULATORY_CARE_PROVIDER_SITE_OTHER): Payer: PRIVATE HEALTH INSURANCE | Admitting: Cardiology

## 2020-07-25 ENCOUNTER — Encounter: Payer: Self-pay | Admitting: Cardiology

## 2020-07-25 VITALS — BP 110/60 | HR 64 | Ht 75.0 in | Wt 230.8 lb

## 2020-07-25 DIAGNOSIS — E118 Type 2 diabetes mellitus with unspecified complications: Secondary | ICD-10-CM

## 2020-07-25 DIAGNOSIS — E785 Hyperlipidemia, unspecified: Secondary | ICD-10-CM

## 2020-07-25 DIAGNOSIS — Z79899 Other long term (current) drug therapy: Secondary | ICD-10-CM

## 2020-07-25 DIAGNOSIS — I2510 Atherosclerotic heart disease of native coronary artery without angina pectoris: Secondary | ICD-10-CM

## 2020-07-25 DIAGNOSIS — Z9861 Coronary angioplasty status: Secondary | ICD-10-CM

## 2020-07-25 MED ORDER — METOPROLOL SUCCINATE ER 25 MG PO TB24: 25.0000 mg | ORAL_TABLET | Freq: Every day | ORAL | 3 refills | Status: AC

## 2020-07-25 NOTE — Patient Instructions (Signed)
Medication Instructions:  Your physician has recommended you make the following change in your medication: Stop metoprolol tartrate Start metoprolol succinate 25 mg by mouth daily  *If you need a refill on your cardiac medications before your next appointment, please call your pharmacy*   Lab Work: Your physician recommends that you return for lab work on 08/01/20--BMP, Hemoglobin A1C, lipid profile.  This will be fasting.  The lab opens at 7:30 AM  If you have labs (blood work) drawn today and your tests are completely normal, you will receive your results only by: Marland Kitchen MyChart Message (if you have MyChart) OR . A paper copy in the mail If you have any lab test that is abnormal or we need to change your treatment, we will call you to review the results.   Testing/Procedures: none   Follow-Up: At [facility] you and your health needs are our priority.  As part of our continuing mission to provide you with exceptional heart care, we have created designated Provider Care Teams.  These Care Teams include your primary Cardiologist (physician) and Advanced Practice Providers (APPs -  Physician Assistants and Nurse Practitioners) who all work together to provide you with the care you need, when you need it.  We recommend signing up for the patient portal called "MyChart".  Sign up information is provided on this After Visit Summary.  MyChart is used to connect with patients for Virtual Visits (Telemedicine).  Patients are able to view lab/test results, encounter notes, upcoming appointments, etc.  Non-urgent messages can be sent to your provider as well.   To learn more about what you can do with MyChart, go to [email or web address].    Your next appointment:   6 month(s)  The format for your next appointment:   In Person  Provider:   You may see [name], MD or one of the following Advanced Practice Providers on your designated Care Team:    Richardson Dopp, PA-C  Robbie Lis, [place]    Other Instructions

## 2020-07-31 ENCOUNTER — Ambulatory Visit: Payer: PRIVATE HEALTH INSURANCE | Admitting: Cardiology

## 2020-08-01 ENCOUNTER — Other Ambulatory Visit: Payer: Self-pay

## 2020-08-01 ENCOUNTER — Other Ambulatory Visit: Payer: PRIVATE HEALTH INSURANCE

## 2020-08-01 DIAGNOSIS — I2510 Atherosclerotic heart disease of native coronary artery without angina pectoris: Secondary | ICD-10-CM

## 2020-08-01 DIAGNOSIS — E785 Hyperlipidemia, unspecified: Secondary | ICD-10-CM

## 2020-08-01 DIAGNOSIS — E118 Type 2 diabetes mellitus with unspecified complications: Secondary | ICD-10-CM

## 2020-08-01 DIAGNOSIS — Z79899 Other long term (current) drug therapy: Secondary | ICD-10-CM

## 2020-08-01 DIAGNOSIS — Z9861 Coronary angioplasty status: Secondary | ICD-10-CM

## 2020-08-02 LAB — BASIC METABOLIC PANEL
BUN/Creatinine Ratio: 19 (ref 10–24)
BUN: 18 mg/dL (ref 8–27)
CO2: 22 mmol/L (ref 20–29)
Calcium: 10 mg/dL (ref 8.6–10.2)
Chloride: 106 mmol/L (ref 96–106)
Creatinine, Ser: 0.95 mg/dL (ref 0.76–1.27)
GFR calc Af Amer: 97 mL/min/{1.73_m2} (ref >59)
GFR calc non Af Amer: 84 mL/min/{1.73_m2} (ref >59)
Glucose: 122 mg/dL — ABNORMAL HIGH (ref 65–99)
Potassium: 4.9 mmol/L (ref 3.5–5.2)
Sodium: 141 mmol/L (ref 134–144)

## 2020-08-02 LAB — HEMOGLOBIN A1C
Est. average glucose Bld gHb Est-mCnc: 143 mg/dL
Hgb A1c MFr Bld: 6.6 % — ABNORMAL HIGH (ref 4.8–5.6)

## 2020-08-02 LAB — LIPID PANEL
Chol/HDL Ratio: 2.7 ratio (ref 0.0–5.0)
Cholesterol, Total: 104 mg/dL (ref 100–199)
HDL: 38 mg/dL — ABNORMAL LOW (ref >39)
LDL Chol Calc (NIH): 49 mg/dL (ref 0–99)
Triglycerides: 83 mg/dL (ref 0–149)
VLDL Cholesterol Cal: 17 mg/dL (ref 5–40)

## 2020-09-17 ENCOUNTER — Telehealth: Payer: Self-pay | Admitting: *Deleted

## 2020-09-17 DIAGNOSIS — Z006 Encounter for examination for normal comparison and control in clinical research program: Secondary | ICD-10-CM

## 2020-09-17 NOTE — Telephone Encounter (Signed)
I called patient for 90-day phone call for Identify Study.Patient is doing well after receiving stent. Patient has no cardiac symptoms. I will call patient in November 2022 for 1-year study phone call.

## 2020-09-20 ENCOUNTER — Other Ambulatory Visit: Payer: Self-pay | Admitting: Internal Medicine

## 2020-09-20 ENCOUNTER — Telehealth: Payer: Self-pay | Admitting: Internal Medicine

## 2020-09-20 DIAGNOSIS — I10 Essential (primary) hypertension: Secondary | ICD-10-CM

## 2020-09-20 DIAGNOSIS — E118 Type 2 diabetes mellitus with unspecified complications: Secondary | ICD-10-CM

## 2020-09-20 MED ORDER — OLMESARTAN MEDOXOMIL 20 MG PO TABS: 20.0000 mg | ORAL_TABLET | Freq: Every day | ORAL | 0 refills | Status: AC

## 2020-09-20 NOTE — Telephone Encounter (Signed)
1.Medication Requested: olmesartan (BENICAR) 20 MG tablet    2. Pharmacy ([place], [place]): [id number] - [place], [place]  3. On Med List: yes   4. Last Visit with PCP: 04-01-20  5. Next visit date with PCP: n/a    Agent: Please be advised that RX refills may take up to 3 business days. We ask that you follow-up with your pharmacy.

## 2020-10-26 IMAGING — CT CT CARDIAC CORONARY ARTERY CALCIUM SCORE
3 series · 14 of 20 positions shown, 15 images · non-contrast
Comparison: None.
COMPARISON: None.

Addendum:
EXAM:
OVER-READ INTERPRETATION  CT CHEST

The following report is an over-read performed by radiologist Dr.
Meylin Greenidge [REDACTED] on 01/01/2020. This
over-read does not include interpretation of cardiac or coronary
anatomy or pathology. The coronary calcium score interpretation by
the cardiologist is attached.
CLINICAL DATA: Risk stratification
Coronary Calcium Score
TECHNIQUE: The patient was scanned on a Siemens Force scanner. Axial
non-contrast 3 mm slices were carried out through the heart. The
data set was analyzed on a dedicated work station and scored using
the Agatson method.

[Series 2: casc 3.0 bv41 2 bestdiast 68 % · axial · 0.41mm/px · z∈[-207,-132]mm · 4 of 43 slices shown, 5 images]
[im 9/43  vessel]
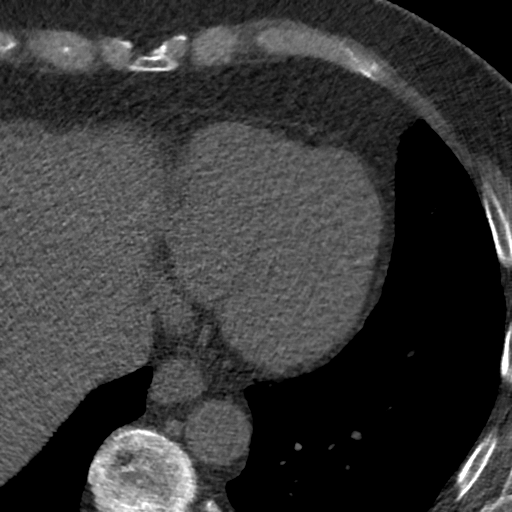
[im 9/43  lung]
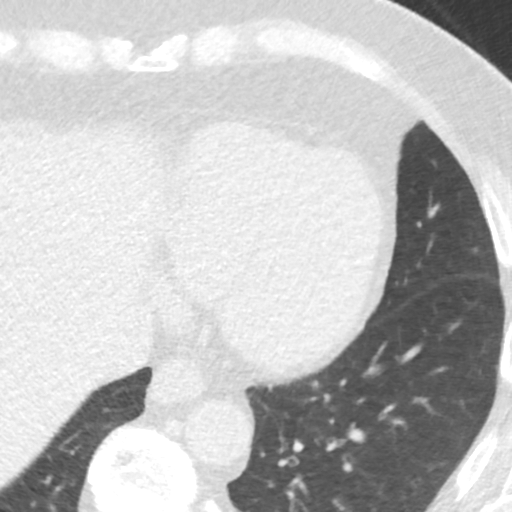
[im 17/43  vessel]
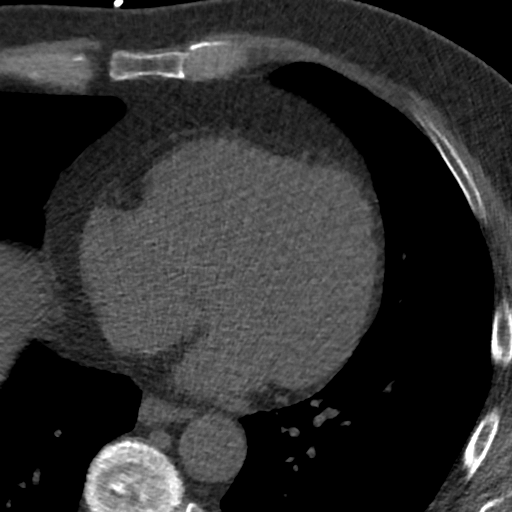
[im 26/43  vessel]
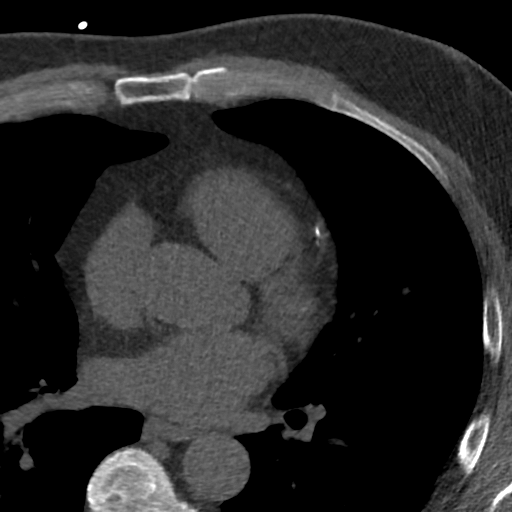
[im 34/43  vessel]
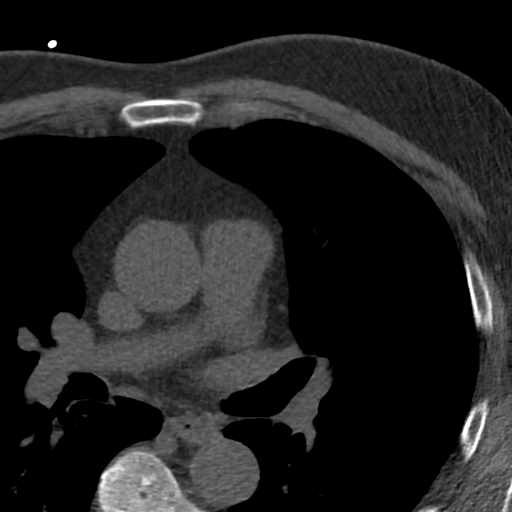

[Series 3: lung 67 % · axial · 0.71mm/px · z∈[-210,-126]mm · 5 of 43 slices shown]
[im 8/43  lung]
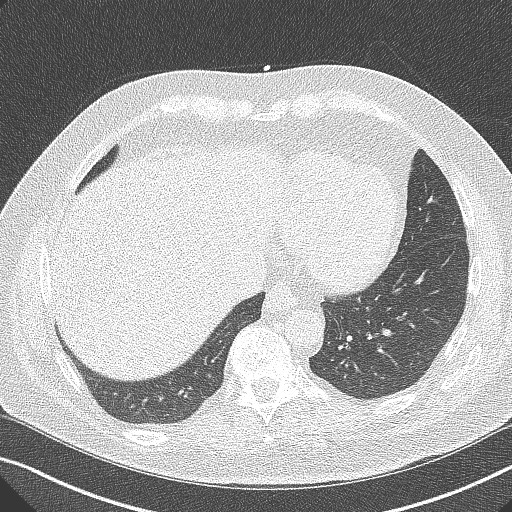
[im 15/43  lung]
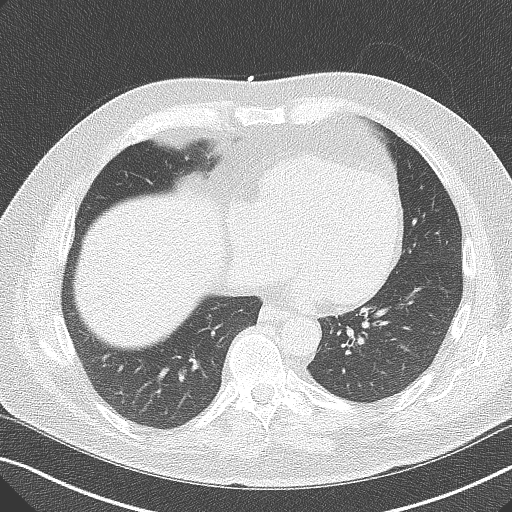
[im 22/43  lung]
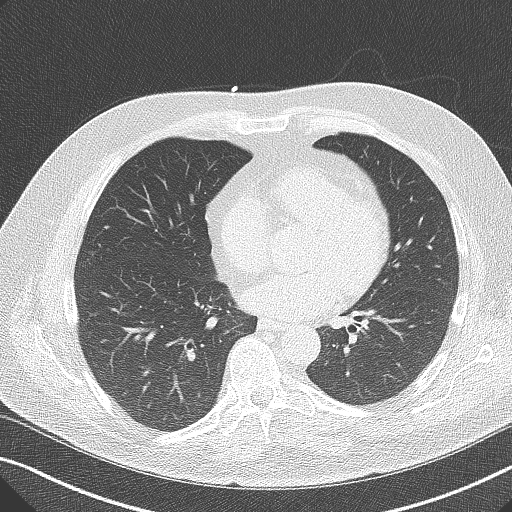
[im 29/43  lung]
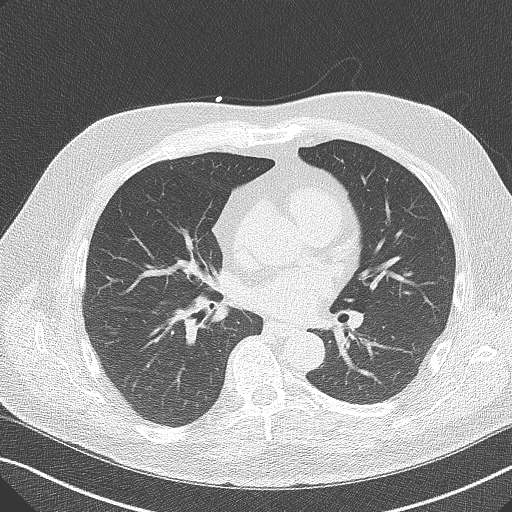
[im 36/43  lung]
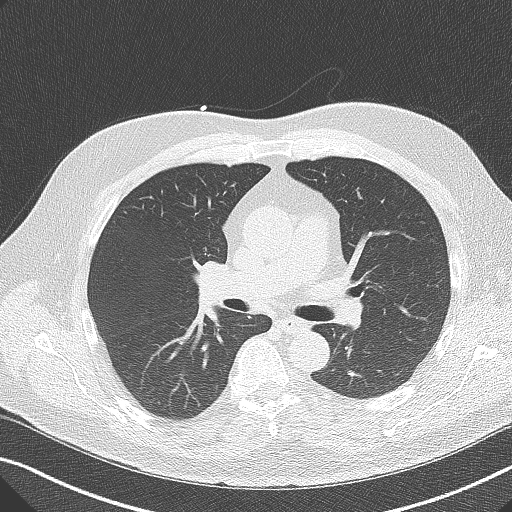

[Series 4: lung st 67 % · axial · 0.71mm/px · z∈[-210,-126]mm · 5 of 43 slices shown]
[im 8/43  lung]
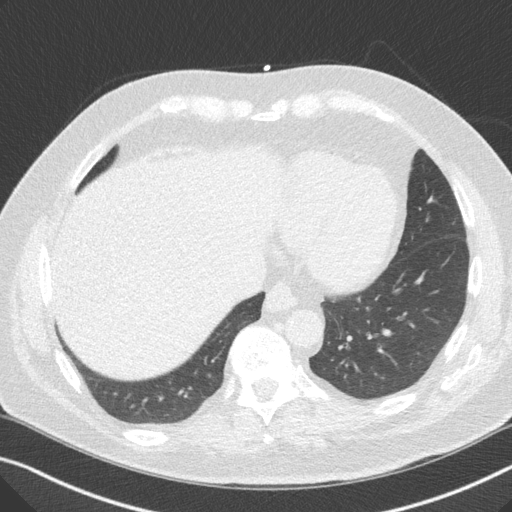
[im 15/43  lung]
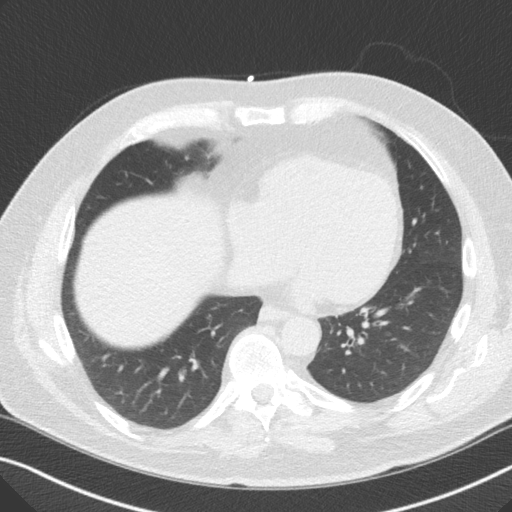
[im 22/43  lung]
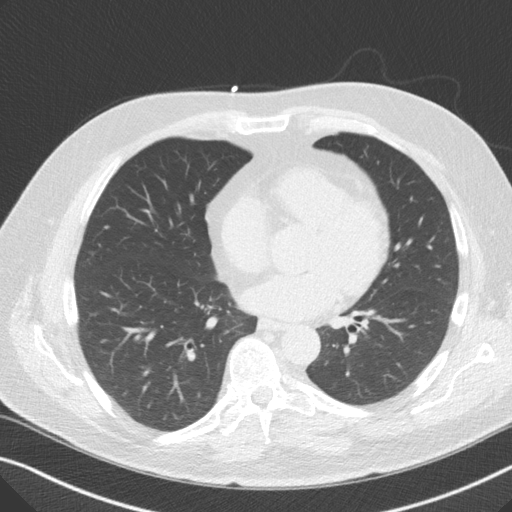
[im 29/43  lung]
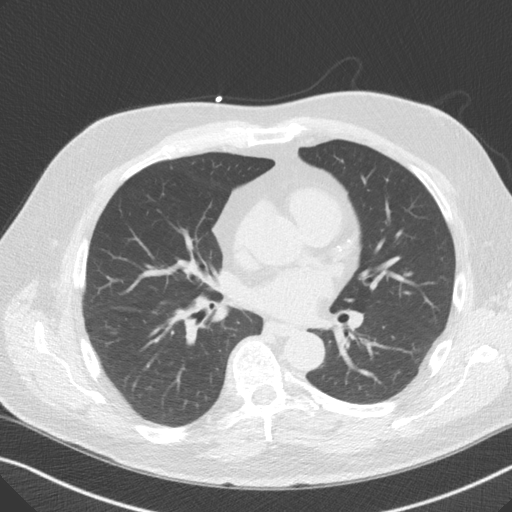
[im 36/43  lung]
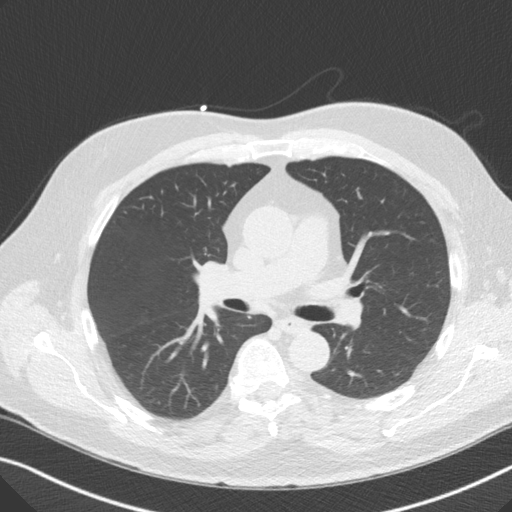

[14 of 20 positions shown; findings below may reference images not displayed]

FINDINGS: Aortic atherosclerosis. Within the visualized portions of the thorax
there are no suspicious appearing pulmonary nodules or masses, there
is no acute consolidative airspace disease, no pleural effusions, no
pneumothorax and no lymphadenopathy. Visualized portions of the
upper abdomen demonstrates mild diffuse low attenuation throughout
the visualized hepatic parenchyma, indicative of hepatic steatosis.
There are no aggressive appearing lytic or blastic lesions noted in
the visualized portions of the skeleton.
IMPRESSION: 1.  Aortic Atherosclerosis (J36AQ-LCF.F).
2. Hepatic steatosis.
FINDINGS: Non-cardiac: See separate report from [REDACTED].

Ascending Aorta: Normal caliber.

Pericardium: Normal.

Coronary arteries: Normal origins.
IMPRESSION: Coronary calcium score of 149. This was 64th percentile for age and
sex matched controls.

*** End of Addendum ***
EXAM:
OVER-READ INTERPRETATION  CT CHEST

The following report is an over-read performed by radiologist Dr.
Meylin Greenidge [REDACTED] on 01/01/2020. This
over-read does not include interpretation of cardiac or coronary
anatomy or pathology. The coronary calcium score interpretation by
the cardiologist is attached.
FINDINGS: Aortic atherosclerosis. Within the visualized portions of the thorax
there are no suspicious appearing pulmonary nodules or masses, there
is no acute consolidative airspace disease, no pleural effusions, no
pneumothorax and no lymphadenopathy. Visualized portions of the
upper abdomen demonstrates mild diffuse low attenuation throughout
the visualized hepatic parenchyma, indicative of hepatic steatosis.
There are no aggressive appearing lytic or blastic lesions noted in
the visualized portions of the skeleton.
IMPRESSION: 1.  Aortic Atherosclerosis (J36AQ-LCF.F).
2. Hepatic steatosis.

## 2021-03-13 ENCOUNTER — Other Ambulatory Visit: Payer: Self-pay | Admitting: Internal Medicine

## 2021-03-13 DIAGNOSIS — E118 Type 2 diabetes mellitus with unspecified complications: Secondary | ICD-10-CM

## 2021-03-13 DIAGNOSIS — I10 Essential (primary) hypertension: Secondary | ICD-10-CM

## 2021-06-05 ENCOUNTER — Encounter: Payer: Self-pay | Admitting: Internal Medicine
